# Patient Record
Sex: Male | Born: 1970 | Race: White | Hispanic: Yes | Marital: Single | Smoking: Never smoker
Health system: Southern US, Community
[De-identification: ages and names within clinical notes are randomized; demographics above are authoritative.]

## PROBLEM LIST (undated history)

## (undated) DIAGNOSIS — IMO0002 Reserved for concepts with insufficient information to code with codable children: Secondary | ICD-10-CM

## (undated) DIAGNOSIS — M25532 Pain in left wrist: Secondary | ICD-10-CM

## (undated) DIAGNOSIS — R112 Nausea with vomiting, unspecified: Secondary | ICD-10-CM

## (undated) DIAGNOSIS — Z9889 Other specified postprocedural states: Secondary | ICD-10-CM

---

## 2008-04-08 ENCOUNTER — Emergency Department: Payer: Self-pay | Admitting: Emergency Medicine

## 2010-10-27 ENCOUNTER — Ambulatory Visit: Payer: Self-pay | Admitting: Emergency Medicine

## 2010-11-03 ENCOUNTER — Ambulatory Visit: Payer: Self-pay | Admitting: Unknown Physician Specialty

## 2012-09-23 HISTORY — PX: OTHER SURGICAL HISTORY: SHX169

## 2013-01-25 HISTORY — PX: OTHER SURGICAL HISTORY: SHX169

## 2015-10-25 ENCOUNTER — Other Ambulatory Visit: Payer: Self-pay | Admitting: Specialist

## 2015-10-25 DIAGNOSIS — M48061 Spinal stenosis, lumbar region without neurogenic claudication: Secondary | ICD-10-CM

## 2015-10-26 ENCOUNTER — Other Ambulatory Visit: Payer: Self-pay

## 2015-11-03 ENCOUNTER — Ambulatory Visit
Admission: RE | Admit: 2015-11-03 | Discharge: 2015-11-03 | Disposition: A | Discharge status: Home / Self Care | Payer: Worker's Compensation | Source: Ambulatory Visit | Attending: Specialist | Admitting: Specialist

## 2015-11-03 DIAGNOSIS — M48061 Spinal stenosis, lumbar region without neurogenic claudication: Secondary | ICD-10-CM

## 2015-11-03 MED ORDER — IOHEXOL 180 MG/ML  SOLN
15.0000 mL | Freq: Once | INTRAMUSCULAR | Status: AC | PRN
  Administered 2015-11-03: 15 mL via INTRATHECAL

## 2015-11-03 MED ORDER — ONDANSETRON HCL 4 MG/2ML IJ SOLN
4.0000 mg | Freq: Once | INTRAMUSCULAR | Status: AC
  Administered 2015-11-03: 4 mg via INTRAMUSCULAR

## 2015-11-03 MED ORDER — DIAZEPAM 5 MG PO TABS
5.0000 mg | ORAL_TABLET | Freq: Once | ORAL | Status: AC
  Administered 2015-11-03: 5 mg via ORAL

## 2015-11-03 MED ORDER — MEPERIDINE HCL 100 MG/ML IJ SOLN
75.0000 mg | Freq: Once | INTRAMUSCULAR | Status: AC
  Administered 2015-11-03: 75 mg via INTRAMUSCULAR

## 2015-11-03 NOTE — Discharge Instructions (Signed)

## 2015-11-03 NOTE — Progress Notes (Signed)
Called Barbara CowerJason, the pt's boss and explained he could not work tomorrow and that I would write an excuse for him to bring with him on Sat.

## 2015-11-03 NOTE — Progress Notes (Signed)
Discharge instructions explained to pt by interpreter.

## 2016-03-15 ENCOUNTER — Ambulatory Visit: Payer: Self-pay | Admitting: Orthopedic Surgery

## 2016-03-19 ENCOUNTER — Ambulatory Visit: Payer: Self-pay | Admitting: Orthopedic Surgery

## 2016-03-19 NOTE — H&P (Signed)
Kenneth Navarro is an 45 y.o. male.   Chief Complaint: back and L leg pain HPI: The patient is a 45 year old male who presents today for follow up of their back. The patient is being followed for their low back symptoms. They are now 3 years, 3 months out from injury (DOI 09/17/12). Symptoms reported today include: pain. Current treatment includes: activity modification and pain medications. The following medication has been used for pain control: Tylenol (prn). The patient presents today following lumbar CT/Myelogram. Note for "Follow-up back": The patient is working full duty.  Kenneth Navarro follows up, having continued left lower extremity radicular pain, it is radiating actually into the top of his foot and great toe. He has had a CT myelogram and that is here for evaluation. He is now nearly three years into the symptoms with his symptomatology. He has had selective nerve root blocks at L4-5 with temporary relief of the symptoms. He would like a prescription for pain medicine as it has worsened over the past couple of months.  No past medical history on file.  No past surgical history on file.  No family history on file. Social History:  has no tobacco, alcohol, and drug history on file.  Allergies: No Known Allergies   (Not in a hospital admission)  No results found for this or any previous visit (from the past 48 hour(s)). No results found.  Review of Systems  Constitutional: Negative.   HENT: Negative.   Eyes: Negative.   Respiratory: Negative.   Cardiovascular: Negative.   Gastrointestinal: Negative.   Genitourinary: Negative.   Musculoskeletal: Positive for back pain.  Skin: Negative.   Neurological: Positive for sensory change and focal weakness.  Psychiatric/Behavioral: Negative.     There were no vitals taken for this visit. Physical Exam  Constitutional: He is oriented to person, place, and time. He appears well-developed.  HENT:  Head: Normocephalic.  Eyes:  Pupils are equal, round, and reactive to light.  Neck: Normal range of motion.  Cardiovascular: Normal rate.   Respiratory: Effort normal.  GI: Soft.  Musculoskeletal:  On exam, moderate distress. Straight leg raise marked buttock, thigh and calf on the left, negative on the right, but EHL weakness 4+/5 left compared to the right. He has pain with extension and flexion of the lumbar spine. Lumbar spine exam reveals no evidence of soft tissue swelling, deformity or skin ecchymosis. On palpation there is no tenderness of the lumbar spine. No flank pain with percussion. The abdomen is soft and nontender. Nontender over the trochanters. No cellulitis or lymphadenopathy.  Motor is 5/5 including tibialis anterior, plantar flexion, quadriceps and hamstrings. Patient is normoreflexic. There is no Babinski or clonus. Sensory exam is intact to light touch. Patient has good distal pulses. No DVT. No pain and normal range of motion without instability of the hips, knees and ankles.   Neurological: He is alert and oriented to person, place, and time.    Prior MRI 2014 reviewed with a disc bulge at L5-S1. No neurocompressive lesions. Multiple levels of disc degeneration. Repeat x-rays ordered, obtained, reviewed today. AP, lateral, flexion extension, reviewed by Dr. Shelle Iron. No fracture, subluxation, dislocation, lytic or blastic lesions. There is disc degeneration noted at L5-S1 with some loss of disc space, but without full collapse. There is some associated foraminal narrowing. No listhesis or instability noted.  CT myelogram actually demonstrates a worsening of his lateral recess stenosis at L4-5, previous disc at L5-S1 has resolved. Stenosis worse left than  right and a central disc extending into both neural foramen.  On the lateral extension, he has significant decrease in the dye flowing by L4-5. This is in extension. The AP myelogram is underwhelming, but the lateral is significant and in the CT scan, he  does have some facet ligamentum flavum hypertrophy. He did have an annular tear back then and he has a disc protrusion that has worsene  Assessment/Plan Progressive refractory L5 radiculopathy, myotomal weakness, dermatomal dysesthesias with neuro tension signs temporally from nerve block at L4-5 progressive since 2012.  Extensive discussion with surgery concerning his current pathology, relevant anatomy and treatment options. It is clearly worsened since 2012 including the symptomatology, he clearly has L5 radicular component on exam with significant neural tension signs, I do feel he is dynamically compressing the L5 and L4 nerve roots in the lateral recess. We discussed living with his symptoms versus microlumbar decompression versus repeat injections. The injections indicated only gave him 2 to 3 days of relief. I gave him a script for tramadol to be taken as directed. Continue with a light duty as previously discussed. He is getting help from his coworkers. We also discussed surgical decompression and microlumbar decompression. He is otherwise healthy. I had an extensive discussion of the risks and benefits of the lumbar decompression with the patient including bleeding, infection, damage to neurovascular structures, epidural fibrosis, CSF leak requiring repair. We also discussed increase in pain, adjacent segment disease, recurrent disc herniation, need for future surgery including repeat decompression and/or fusion. We also discussed risks of postoperative hematoma, paralysis, anesthetic complications including DVT, PE, death, cardiopulmonary dysfunction. In addition, the perioperative and postoperative courses were discussed in detail including the rehabilitative time and return to functional activity and work. I provided the patient with an illustrated handout and utilized the appropriate surgical models. Overnight in the hospital, two weeks of suture removal. He can return to work at that time in a  light duty position, if he so chooses. Six weeks of physical therapy followed by work conditioning, if necessary. I do believe this is related to his initial injury, it just has progressed since and we treated him over three years period of time and it has finally gotten to the point where it is intolerable. I will schedule him for that procedure.   Dorothy SparkBISSELL, Avon Mergenthaler M., PA-C 03/19/2016, 9:02 AM

## 2016-03-30 NOTE — Progress Notes (Signed)
appt booked through wife who stated he is [place] 04[place]09[place]16.  Said she understands english and can make appt for him    Has strong accent and somewhat hard to understand.  Stated he could be here for appt, and I will request Spanish Interpretor to meet him.

## 2016-04-04 ENCOUNTER — Other Ambulatory Visit (HOSPITAL_COMMUNITY): Payer: Self-pay | Admitting: *Deleted

## 2016-04-04 NOTE — Patient Instructions (Addendum)
Kenneth Navarro  04/04/2016   Your procedure is scheduled on: 04-12-16  [facility]  [facility] to 3rd floor to  Short Stay Center at 515  AM.  Call this number if you have problems the morning of surgery [phone]   Remember: ONLY 1 PERSON MAY GO WITH YOU TO SHORT STAY TO GET  READY MORNING OF YOUR SURGERY.  Do not eat food or drink liquids :After Midnight.     Take these medicines the morning of surgery with A SIP OF WATER:none                               You may not have any metal on your body including hair pins and              piercings  Do not wear jewelry, make-up, lotions, powders or perfumes, deodorant             Do not wear nail polish.  Do not shave  48 hours prior to surgery.              Men may shave face and neck.   Do not bring valuables to the hospital. [facility] IS NOT             RESPONSIBLE   FOR VALUABLES.  Contacts, dentures or bridgework may not be worn into surgery.  Leave suitcase in the car. After surgery it may be brought to your room.                  Please read over the following fact sheets you were given: _____________________________________________________________________             [facility] - Preparing [facility] Before surgery, you can play an important role.  Because skin is not sterile, your skin needs to be as free of germs as possible.  You can reduce the number of germs on your skin by washing with CHG (chlorahexidine gluconate) soap before surgery.  CHG is an antiseptic cleaner which kills germs and bonds with the skin to continue killing germs even after washing. Please DO NOT use if you have an allergy to CHG or antibacterial soaps.  If your skin becomes reddened/irritated stop using the CHG and inform your nurse when you arrive at Short Stay. Do not shave (including legs and underarms) for at least 48 hours prior to the first CHG shower.  You may shave your  face/neck. Please follow these instructions carefully:  1.  Shower with CHG Soap the night before surgery and the  morning of Surgery.  2.  If you choose to wash your hair, wash your hair first as usual with your  normal  shampoo.  3.  After you shampoo, rinse your hair and body thoroughly to remove the  shampoo.                           4.  Use CHG as you would any other liquid soap.  You can apply chg directly  to the skin and wash                       Gently with a scrungie or clean washcloth.  5.  Apply the CHG Soap to your  body ONLY FROM THE NECK DOWN.   Do not use on face/ open                           Wound or open sores. Avoid contact with eyes, ears mouth and genitals (private parts).                       Wash face,  Genitals (private parts) with your normal soap.             6.  Wash thoroughly, paying special attention to the area where your surgery  will be performed.  7.  Thoroughly rinse your body with warm water from the neck down.  8.  DO NOT shower/wash with your normal soap after using and rinsing off  the CHG Soap.                9.  Pat yourself dry with a clean towel.            10.  Wear clean pajamas.            11.  Place clean sheets on your bed the night of your first shower and do not  sleep with pets. Day of Surgery : Do not apply any lotions/deodorants the morning of surgery.  Please wear clean clothes to the hospital/surgery center.  FAILURE TO FOLLOW THESE INSTRUCTIONS MAY RESULT IN THE CANCELLATION OF YOUR SURGERY PATIENT SIGNATURE_________________________________  NURSE SIGNATURE__________________________________  ________________________________________________________________________

## 2016-04-09 ENCOUNTER — Ambulatory Visit (HOSPITAL_COMMUNITY)
Admission: RE | Admit: 2016-04-09 | Discharge: 2016-04-09 | Disposition: A | Discharge status: Home / Self Care | Payer: Worker's Compensation | Source: Ambulatory Visit | Attending: Orthopedic Surgery | Admitting: Orthopedic Surgery

## 2016-04-09 ENCOUNTER — Encounter (HOSPITAL_COMMUNITY): Payer: Self-pay

## 2016-04-09 ENCOUNTER — Encounter (HOSPITAL_COMMUNITY)
Admission: RE | Admit: 2016-04-09 | Discharge: 2016-04-09 | Disposition: A | Discharge status: Home / Self Care | Payer: Worker's Compensation | Source: Ambulatory Visit | Attending: Specialist | Admitting: Specialist

## 2016-04-09 DIAGNOSIS — M5126 Other intervertebral disc displacement, lumbar region: Secondary | ICD-10-CM | POA: Insufficient documentation

## 2016-04-09 HISTORY — DX: Pain in left wrist: M25.532

## 2016-04-09 HISTORY — DX: Other specified postprocedural states: Z98.890

## 2016-04-09 HISTORY — DX: Reserved for concepts with insufficient information to code with codable children: IMO0002

## 2016-04-09 HISTORY — DX: Nausea with vomiting, unspecified: R11.2

## 2016-04-09 LAB — BASIC METABOLIC PANEL
Anion gap: 4 — ABNORMAL LOW (ref 5–15)
BUN: 15 mg/dL (ref 6–20)
CO2: 28 mmol/L (ref 22–32)
CREATININE: 0.98 mg/dL (ref 0.61–1.24)
Calcium: 9.1 mg/dL (ref 8.9–10.3)
Chloride: 105 mmol/L (ref 101–111)
GFR calc Af Amer: >60 mL/min (ref >60)
GFR calc non Af Amer: >60 mL/min (ref >60)
Glucose, Bld: 98 mg/dL (ref 65–99)
Potassium: 4.3 mmol/L (ref 3.5–5.1)
SODIUM: 137 mmol/L (ref 135–145)

## 2016-04-09 LAB — CBC
HCT: 49.7 % (ref 39.0–52.0)
Hemoglobin: 17.2 g/dL — ABNORMAL HIGH (ref 13.0–17.0)
MCH: 30.4 pg (ref 26.0–34.0)
MCHC: 34.6 g/dL (ref 30.0–36.0)
MCV: 88 fL (ref 78.0–100.0)
PLATELETS: 189 10*3/uL (ref 150–400)
RBC: 5.65 MIL/uL (ref 4.22–5.81)
RDW: 12.6 % (ref 11.5–15.5)
WBC: 6.2 10*3/uL (ref 4.0–10.5)

## 2016-04-09 LAB — SURGICAL PCR SCREEN
MRSA, PCR: NEGATIVE
STAPHYLOCOCCUS AUREUS: NEGATIVE

## 2016-04-09 LAB — ABO/RH: ABO/RH(D): O POS

## 2016-04-09 NOTE — Progress Notes (Signed)
email confirmationon chart for spanish interpreter cesar bastias for day of surgery

## 2016-04-11 NOTE — Anesthesia Preprocedure Evaluation (Addendum)
Anesthesia Evaluation  Patient identified by MRN, date of birth, ID band Patient awake    Reviewed: Allergy & Precautions, NPO status , Patient's Chart, lab work & pertinent test results  Airway Mallampati: II       Dental  (+) Teeth Intact, Dental Advisory Given   Pulmonary neg pulmonary ROS,    breath sounds clear to auscultation       Cardiovascular negative cardio ROS   Rhythm:Regular     Neuro/Psych negative neurological ROS  negative psych ROS   GI/Hepatic negative GI ROS, Neg liver ROS,   Endo/Other  negative endocrine ROS  Renal/GU negative Renal ROS  negative genitourinary   Musculoskeletal negative musculoskeletal ROS (+)   Abdominal (+)  Abdomen: soft.    Peds negative pediatric ROS (+)  Hematology negative hematology ROS (+)   Anesthesia Other Findings   Reproductive/Obstetrics negative OB ROS                            Anesthesia Physical Anesthesia Plan  ASA: I  Anesthesia Plan: General   Post-op Pain Management:    Induction: Intravenous  Airway Management Planned: Oral ETT  Additional Equipment:   Intra-op Plan:   Post-operative Plan: Extubation in OR  Informed Consent: I have reviewed the patients History and Physical, chart, labs and discussed the procedure including the risks, benefits and alternatives for the proposed anesthesia with the patient or authorized representative who has indicated his/her understanding and acceptance.     Plan Discussed with:   Anesthesia Plan Comments: (Check am labs, Spanish only)        Anesthesia Quick Evaluation

## 2016-04-12 ENCOUNTER — Ambulatory Visit (HOSPITAL_COMMUNITY): Payer: Worker's Compensation

## 2016-04-12 ENCOUNTER — Encounter (HOSPITAL_COMMUNITY): Payer: Self-pay

## 2016-04-12 ENCOUNTER — Ambulatory Visit (HOSPITAL_COMMUNITY)
Admission: RE | Admit: 2016-04-12 | Discharge: 2016-04-13 | Disposition: A | Discharge status: Home / Self Care | Payer: Worker's Compensation | Source: Ambulatory Visit | Attending: Specialist | Admitting: Specialist

## 2016-04-12 ENCOUNTER — Ambulatory Visit (HOSPITAL_COMMUNITY): Payer: Worker's Compensation | Admitting: Anesthesiology

## 2016-04-12 ENCOUNTER — Encounter (HOSPITAL_COMMUNITY)
Admission: RE | Disposition: A | Discharge status: Home / Self Care | Payer: Self-pay | Source: Ambulatory Visit | Attending: Specialist

## 2016-04-12 DIAGNOSIS — M5126 Other intervertebral disc displacement, lumbar region: Secondary | ICD-10-CM

## 2016-04-12 DIAGNOSIS — Z419 Encounter for procedure for purposes other than remedying health state, unspecified: Secondary | ICD-10-CM

## 2016-04-12 DIAGNOSIS — M48061 Spinal stenosis, lumbar region without neurogenic claudication: Secondary | ICD-10-CM | POA: Diagnosis present

## 2016-04-12 DIAGNOSIS — M5116 Intervertebral disc disorders with radiculopathy, lumbar region: Secondary | ICD-10-CM | POA: Diagnosis not present

## 2016-04-12 HISTORY — PX: LUMBAR LAMINECTOMY/DECOMPRESSION MICRODISCECTOMY: SHX5026

## 2016-04-12 LAB — TYPE AND SCREEN
ABO/RH(D): O POS
ANTIBODY SCREEN: NEGATIVE

## 2016-04-12 SURGERY — LUMBAR LAMINECTOMY/DECOMPRESSION MICRODISCECTOMY
Anesthesia: General | Site: Back | Laterality: Left

## 2016-04-12 MED ORDER — ACETAMINOPHEN 325 MG PO TABS: 650.0000 mg | ORAL_TABLET | ORAL | Status: DC | PRN

## 2016-04-12 MED ORDER — METHOCARBAMOL 1000 MG/10ML IJ SOLN
500.0000 mg | Freq: Four times a day (QID) | INTRAVENOUS | Status: DC | PRN
  Administered 2016-04-12: 500 mg via INTRAVENOUS
  Filled 2016-04-12: qty 5
  Filled 2016-04-12: qty 550

## 2016-04-12 MED ORDER — BUPIVACAINE-EPINEPHRINE 0.5% -1:200000 IJ SOLN
INTRAMUSCULAR | Status: DC | PRN
  Administered 2016-04-12: 13 mL

## 2016-04-12 MED ORDER — ONDANSETRON HCL 4 MG/2ML IJ SOLN: 4.0000 mg | INTRAMUSCULAR | Status: DC | PRN

## 2016-04-12 MED ORDER — METHOCARBAMOL 500 MG PO TABS: 500.0000 mg | ORAL_TABLET | Freq: Four times a day (QID) | ORAL | 1 refills | Status: DC | PRN

## 2016-04-12 MED ORDER — LIDOCAINE HCL (CARDIAC) 20 MG/ML IV SOLN
INTRAVENOUS | Status: AC
  Filled 2016-04-12: qty 5

## 2016-04-12 MED ORDER — BUPIVACAINE-EPINEPHRINE 0.5% -1:200000 IJ SOLN
INTRAMUSCULAR | Status: AC
  Filled 2016-04-12: qty 1

## 2016-04-12 MED ORDER — PROMETHAZINE HCL 25 MG/ML IJ SOLN: 6.2500 mg | INTRAMUSCULAR | Status: DC | PRN

## 2016-04-12 MED ORDER — MAGNESIUM CITRATE PO SOLN: 1.0000 | Freq: Once | ORAL | Status: DC | PRN

## 2016-04-12 MED ORDER — METHOCARBAMOL 500 MG PO TABS: 500.0000 mg | ORAL_TABLET | Freq: Four times a day (QID) | ORAL | Status: DC | PRN

## 2016-04-12 MED ORDER — PROPOFOL 10 MG/ML IV BOLUS
INTRAVENOUS | Status: DC | PRN
  Administered 2016-04-12: 200 mg via INTRAVENOUS

## 2016-04-12 MED ORDER — FENTANYL CITRATE (PF) 100 MCG/2ML IJ SOLN
INTRAMUSCULAR | Status: AC
  Filled 2016-04-12: qty 2

## 2016-04-12 MED ORDER — BISACODYL 5 MG PO TBEC: 5.0000 mg | DELAYED_RELEASE_TABLET | Freq: Every day | ORAL | Status: DC | PRN

## 2016-04-12 MED ORDER — LACTATED RINGERS IV SOLN
INTRAVENOUS | Status: DC
  Administered 2016-04-12 (×2): via INTRAVENOUS

## 2016-04-12 MED ORDER — OXYCODONE-ACETAMINOPHEN 5-325 MG PO TABS: 1.0000 | ORAL_TABLET | ORAL | 0 refills | Status: DC | PRN

## 2016-04-12 MED ORDER — PHENYLEPHRINE 40 MCG/ML (10ML) SYRINGE FOR IV PUSH (FOR BLOOD PRESSURE SUPPORT)
PREFILLED_SYRINGE | INTRAVENOUS | Status: AC
  Filled 2016-04-12: qty 10

## 2016-04-12 MED ORDER — MENTHOL 3 MG MT LOZG: 1.0000 | LOZENGE | OROMUCOSAL | Status: DC | PRN

## 2016-04-12 MED ORDER — FENTANYL CITRATE (PF) 100 MCG/2ML IJ SOLN
INTRAMUSCULAR | Status: DC | PRN
  Administered 2016-04-12 (×2): 50 ug via INTRAVENOUS
  Administered 2016-04-12: 150 ug via INTRAVENOUS

## 2016-04-12 MED ORDER — CEFAZOLIN SODIUM-DEXTROSE 2-4 GM/100ML-% IV SOLN
2.0000 g | INTRAVENOUS | Status: AC
  Administered 2016-04-12: 2 g via INTRAVENOUS
  Filled 2016-04-12: qty 100

## 2016-04-12 MED ORDER — ROCURONIUM BROMIDE 100 MG/10ML IV SOLN
INTRAVENOUS | Status: DC | PRN
  Administered 2016-04-12: 5 mg via INTRAVENOUS
  Administered 2016-04-12: 45 mg via INTRAVENOUS

## 2016-04-12 MED ORDER — PHENYLEPHRINE HCL 10 MG/ML IJ SOLN
INTRAMUSCULAR | Status: DC | PRN
  Administered 2016-04-12: 80 ug via INTRAVENOUS
  Administered 2016-04-12: 40 ug via INTRAVENOUS

## 2016-04-12 MED ORDER — RISAQUAD PO CAPS
1.0000 | ORAL_CAPSULE | Freq: Every day | ORAL | Status: DC
  Administered 2016-04-12: 1 via ORAL
  Filled 2016-04-12: qty 1

## 2016-04-12 MED ORDER — THROMBIN 5000 UNITS EX SOLR
CUTANEOUS | Status: AC
  Filled 2016-04-12: qty 5000

## 2016-04-12 MED ORDER — ARTIFICIAL TEARS OP OINT
TOPICAL_OINTMENT | OPHTHALMIC | Status: AC
  Filled 2016-04-12: qty 3.5

## 2016-04-12 MED ORDER — SODIUM CHLORIDE 0.9 % IR SOLN
Status: AC
  Filled 2016-04-12: qty 1

## 2016-04-12 MED ORDER — KCL IN DEXTROSE-NACL 20-5-0.45 MEQ/L-%-% IV SOLN
INTRAVENOUS | Status: DC
  Administered 2016-04-12: 50 mL/h via INTRAVENOUS
  Filled 2016-04-12 (×2): qty 1000

## 2016-04-12 MED ORDER — DEXAMETHASONE SODIUM PHOSPHATE 10 MG/ML IJ SOLN
INTRAMUSCULAR | Status: AC
  Filled 2016-04-12: qty 1

## 2016-04-12 MED ORDER — OXYCODONE-ACETAMINOPHEN 5-325 MG PO TABS
1.0000 | ORAL_TABLET | ORAL | Status: DC | PRN
  Administered 2016-04-12: 2 via ORAL
  Administered 2016-04-12 (×2): 1 via ORAL
  Administered 2016-04-12 – 2016-04-13 (×3): 2 via ORAL
  Filled 2016-04-12 (×3): qty 2
  Filled 2016-04-12: qty 1
  Filled 2016-04-12: qty 2
  Filled 2016-04-12: qty 1

## 2016-04-12 MED ORDER — CEFAZOLIN SODIUM-DEXTROSE 2-4 GM/100ML-% IV SOLN
INTRAVENOUS | Status: AC
  Filled 2016-04-12: qty 100

## 2016-04-12 MED ORDER — MEPERIDINE HCL 50 MG/ML IJ SOLN: 6.2500 mg | INTRAMUSCULAR | Status: DC | PRN

## 2016-04-12 MED ORDER — EPHEDRINE SULFATE 50 MG/ML IJ SOLN
INTRAMUSCULAR | Status: AC
  Filled 2016-04-12: qty 1

## 2016-04-12 MED ORDER — HYDROCODONE-ACETAMINOPHEN 5-325 MG PO TABS: 1.0000 | ORAL_TABLET | ORAL | Status: DC | PRN

## 2016-04-12 MED ORDER — ONDANSETRON HCL 4 MG/2ML IJ SOLN
INTRAMUSCULAR | Status: AC
  Filled 2016-04-12: qty 2

## 2016-04-12 MED ORDER — ONDANSETRON HCL 4 MG/2ML IJ SOLN
INTRAMUSCULAR | Status: DC | PRN
  Administered 2016-04-12: 4 mg via INTRAVENOUS

## 2016-04-12 MED ORDER — PHENOL 1.4 % MT LIQD
1.0000 | OROMUCOSAL | Status: DC | PRN
  Filled 2016-04-12: qty 177

## 2016-04-12 MED ORDER — EPHEDRINE SULFATE 50 MG/ML IJ SOLN
INTRAMUSCULAR | Status: DC | PRN
  Administered 2016-04-12: 10 mg via INTRAVENOUS

## 2016-04-12 MED ORDER — ACETAMINOPHEN 650 MG RE SUPP: 650.0000 mg | RECTAL | Status: DC | PRN

## 2016-04-12 MED ORDER — SODIUM CHLORIDE 0.9 % IR SOLN
Status: DC | PRN
  Administered 2016-04-12: 500 mL

## 2016-04-12 MED ORDER — DOCUSATE SODIUM 100 MG PO CAPS
100.0000 mg | ORAL_CAPSULE | Freq: Two times a day (BID) | ORAL | Status: DC
  Administered 2016-04-12 – 2016-04-13 (×2): 100 mg via ORAL
  Filled 2016-04-12 (×2): qty 1

## 2016-04-12 MED ORDER — DOCUSATE SODIUM 100 MG PO CAPS: 100.0000 mg | ORAL_CAPSULE | Freq: Two times a day (BID) | ORAL | 1 refills | Status: DC | PRN

## 2016-04-12 MED ORDER — ROCURONIUM BROMIDE 100 MG/10ML IV SOLN
INTRAVENOUS | Status: AC
  Filled 2016-04-12: qty 1

## 2016-04-12 MED ORDER — SUGAMMADEX SODIUM 200 MG/2ML IV SOLN
INTRAVENOUS | Status: AC
  Filled 2016-04-12: qty 2

## 2016-04-12 MED ORDER — LIDOCAINE 2% (20 MG/ML) 5 ML SYRINGE
INTRAMUSCULAR | Status: DC | PRN
  Administered 2016-04-12: 100 mg via INTRAVENOUS

## 2016-04-12 MED ORDER — HYDROMORPHONE HCL 1 MG/ML IJ SOLN: 0.5000 mg | INTRAMUSCULAR | Status: DC | PRN

## 2016-04-12 MED ORDER — POLYETHYLENE GLYCOL 3350 17 G PO PACK: 17.0000 g | PACK | Freq: Every day | ORAL | Status: DC | PRN

## 2016-04-12 MED ORDER — SUGAMMADEX SODIUM 200 MG/2ML IV SOLN
INTRAVENOUS | Status: DC | PRN
  Administered 2016-04-12: 200 mg via INTRAVENOUS

## 2016-04-12 MED ORDER — CEFAZOLIN SODIUM-DEXTROSE 2-4 GM/100ML-% IV SOLN
2.0000 g | Freq: Three times a day (TID) | INTRAVENOUS | Status: AC
  Administered 2016-04-12 (×2): 2 g via INTRAVENOUS
  Filled 2016-04-12 (×2): qty 100

## 2016-04-12 MED ORDER — FENTANYL CITRATE (PF) 100 MCG/2ML IJ SOLN
25.0000 ug | INTRAMUSCULAR | Status: AC | PRN
  Administered 2016-04-12 (×6): 25 ug via INTRAVENOUS

## 2016-04-12 MED ORDER — DEXAMETHASONE SODIUM PHOSPHATE 10 MG/ML IJ SOLN
INTRAMUSCULAR | Status: DC | PRN
  Administered 2016-04-12: 10 mg via INTRAVENOUS

## 2016-04-12 MED ORDER — ALUM & MAG HYDROXIDE-SIMETH 200-200-20 MG/5ML PO SUSP: 30.0000 mL | Freq: Four times a day (QID) | ORAL | Status: DC | PRN

## 2016-04-12 MED ORDER — POLYETHYLENE GLYCOL 3350 17 G PO PACK: 17.0000 g | PACK | Freq: Every day | ORAL | 0 refills | Status: DC

## 2016-04-12 MED ORDER — MIDAZOLAM HCL 2 MG/2ML IJ SOLN
INTRAMUSCULAR | Status: AC
  Filled 2016-04-12: qty 2

## 2016-04-12 MED ORDER — FENTANYL CITRATE (PF) 250 MCG/5ML IJ SOLN
INTRAMUSCULAR | Status: AC
  Filled 2016-04-12: qty 5

## 2016-04-12 MED ORDER — PROPOFOL 10 MG/ML IV BOLUS
INTRAVENOUS | Status: AC
  Filled 2016-04-12: qty 20

## 2016-04-12 MED ORDER — MIDAZOLAM HCL 5 MG/5ML IJ SOLN
INTRAMUSCULAR | Status: DC | PRN
  Administered 2016-04-12: 2 mg via INTRAVENOUS

## 2016-04-12 SURGICAL SUPPLY — 44 items
BAG ZIPLOCK 12X15 (MISCELLANEOUS) IMPLANT
CLOSURE WOUND 1/2 X4 (GAUZE/BANDAGES/DRESSINGS) ×1
CLOTH 2% CHLOROHEXIDINE 3PK (PERSONAL CARE ITEMS) ×3 IMPLANT
DRAPE MICROSCOPE LEICA (MISCELLANEOUS) ×3 IMPLANT
DRAPE SHEET LG 3/4 BI-LAMINATE (DRAPES) IMPLANT
DRAPE SURG 17X11 SM STRL (DRAPES) ×3 IMPLANT
DRAPE UTILITY XL STRL (DRAPES) ×3 IMPLANT
DRSG AQUACEL AG ADV 3.5X 4 (GAUZE/BANDAGES/DRESSINGS) ×3 IMPLANT
DRSG AQUACEL AG ADV 3.5X 6 (GAUZE/BANDAGES/DRESSINGS) IMPLANT
DURAPREP 26ML APPLICATOR (WOUND CARE) ×3 IMPLANT
DURASEAL SPINE SEALANT 3ML (MISCELLANEOUS) IMPLANT
ELECT BLADE TIP CTD 4 INCH (ELECTRODE) ×3 IMPLANT
ELECT REM PT RETURN 9FT ADLT (ELECTROSURGICAL) ×3
ELECTRODE REM PT RTRN 9FT ADLT (ELECTROSURGICAL) ×1 IMPLANT
GLOVE BIOGEL PI IND STRL 7.0 (GLOVE) ×1 IMPLANT
GLOVE BIOGEL PI INDICATOR 7.0 (GLOVE) ×2
GLOVE SURG SS PI 7.0 STRL IVOR (GLOVE) ×3 IMPLANT
GLOVE SURG SS PI 7.5 STRL IVOR (GLOVE) ×3 IMPLANT
GLOVE SURG SS PI 8.0 STRL IVOR (GLOVE) ×6 IMPLANT
GOWN STRL REUS W/TWL XL LVL3 (GOWN DISPOSABLE) ×6 IMPLANT
HEMOSTAT SPONGE AVITENE ULTRA (HEMOSTASIS) ×3 IMPLANT
IV CATH 14GX2 1/4 (CATHETERS) IMPLANT
KIT BASIN OR (CUSTOM PROCEDURE TRAY) ×3 IMPLANT
KIT POSITIONING SURG ANDREWS (MISCELLANEOUS) ×3 IMPLANT
MANIFOLD NEPTUNE II (INSTRUMENTS) ×3 IMPLANT
NEEDLE SPNL 18GX3.5 QUINCKE PK (NEEDLE) ×6 IMPLANT
PACK LAMINECTOMY ORTHO (CUSTOM PROCEDURE TRAY) ×3 IMPLANT
PATTIES SURGICAL .5 X.5 (GAUZE/BANDAGES/DRESSINGS) IMPLANT
PATTIES SURGICAL .75X.75 (GAUZE/BANDAGES/DRESSINGS) ×3 IMPLANT
RUBBERBAND STERILE (MISCELLANEOUS) ×6 IMPLANT
SPONGE SURGIFOAM ABS GEL 100 (HEMOSTASIS) ×3 IMPLANT
STAPLER VISISTAT (STAPLE) IMPLANT
STRIP CLOSURE SKIN 1/2X4 (GAUZE/BANDAGES/DRESSINGS) ×2 IMPLANT
SUT NURALON 4 0 TR CR/8 (SUTURE) IMPLANT
SUT PROLENE 3 0 PS 2 (SUTURE) IMPLANT
SUT VIC AB 1 CT1 27 (SUTURE)
SUT VIC AB 1 CT1 27XBRD ANTBC (SUTURE) IMPLANT
SUT VIC AB 1-0 CT2 27 (SUTURE) ×3 IMPLANT
SUT VIC AB 2-0 CT1 27 (SUTURE)
SUT VIC AB 2-0 CT1 TAPERPNT 27 (SUTURE) IMPLANT
SUT VIC AB 2-0 CT2 27 (SUTURE) ×3 IMPLANT
SYR 3ML LL SCALE MARK (SYRINGE) IMPLANT
TOWEL OR 17X26 10 PK STRL BLUE (TOWEL DISPOSABLE) ×3 IMPLANT
YANKAUER SUCT BULB TIP NO VENT (SUCTIONS) ×3 IMPLANT

## 2016-04-12 NOTE — Transfer of Care (Signed)
Immediate Anesthesia Transfer of Care Note  Patient: Kenneth Navarro  Procedure(s) Performed: Procedure(s): MICRO LUMBAR DECOMPRESSION L4 - L5 ON THE LEFT (Left)  Patient Location: PACU  Anesthesia Type:General  Level of Consciousness:  sedated, patient cooperative and responds to stimulation  Airway & Oxygen Therapy:Patient Spontanous Breathing and Patient connected to face mask oxgen  Post-op Assessment:  Report given to PACU RN and Post -op Vital signs reviewed and stable  Post vital signs:  Reviewed and stable  Last Vitals:  Vitals:   04/12/16 0530  BP: 137/86  Pulse: 63  Resp: 16  Temp: 36.7 C    Complications: No apparent anesthesia complications

## 2016-04-12 NOTE — Anesthesia Procedure Notes (Signed)
Procedure Name: Intubation Date/Time: 04/12/2016 7:35 AM Performed by: Jhonnie GarnerMARSHALL, Chaselynn Kepple M Pre-anesthesia Checklist: Patient identified, Emergency Drugs available, Suction available and Patient being monitored Patient Re-evaluated:Patient Re-evaluated prior to inductionOxygen Delivery Method: Circle system utilized Preoxygenation: Pre-oxygenation with 100% oxygen Intubation Type: IV induction Ventilation: Mask ventilation without difficulty Laryngoscope Size: Miller and 2 Grade View: Grade I Tube type: Oral Tube size: 7.5 mm Number of attempts: 1 Airway Equipment and Method: Stylet Placement Confirmation: ETT inserted through vocal cords under direct vision,  positive ETCO2 and breath sounds checked- equal and bilateral Secured at: 21 cm Tube secured with: Tape Dental Injury: Teeth and Oropharynx as per pre-operative assessment

## 2016-04-12 NOTE — Interval H&P Note (Signed)
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 After consideration of risks, benefits and other options for treatment, the patient has consented to  Procedure(s): MICRO LUMBAR DECOMPRESSION L4 - L5 ON THE LEFT (Left) as a surgical intervention .  The patient's history has been reviewed, patient examined, no change in status, stable for surgery.  I have reviewed the patient's chart and labs.  Questions were answered to the patient's satisfaction.     Alexande Sheerin C

## 2016-04-12 NOTE — Discharge Instructions (Signed)

## 2016-04-12 NOTE — Anesthesia Postprocedure Evaluation (Signed)
Anesthesia Post Note  Patient: Kenneth Navarro  Procedure(s) Performed: Procedure(s) (LRB): MICRO LUMBAR DECOMPRESSION L4 - L5 ON THE LEFT (Left)  Patient location during evaluation: PACU Anesthesia Type: General Level of consciousness: awake and alert Pain management: pain level controlled Vital Signs Assessment: post-procedure vital signs reviewed and stable Respiratory status: spontaneous breathing, nonlabored ventilation, respiratory function stable and patient connected to nasal cannula oxygen Cardiovascular status: blood pressure returned to baseline and stable Postop Assessment: no signs of nausea or vomiting Anesthetic complications: no    Last Vitals:  Vitals:   04/12/16 1005 04/12/16 1015  BP:  124/83  Pulse: 84 86  Resp: (!) 8 12  Temp:      Last Pain:  Vitals:   04/12/16 1015  TempSrc:   PainSc: Asleep                 Sebastian Acheheodore Casper Pagliuca

## 2016-04-12 NOTE — Op Note (Signed)
NAMDorcas Carrow:  Zuk, Powell                ACCOUNT NO.:  [id number]  MEDICAL RECORD NO.:  [id number]  LOCATION:  [facility]                         [facility]  PHYSICIAN:  Jene EveryJeffrey Odessia Asleson, M.D.    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  POSTOPERATIVE DIAGNOSIS:  Spinal stenosis, herniated nucleus pulposus at L4-5, left.  PROCEDURE PERFORMED: 1. Microlumbar decompression L4-5, left. 2. Foraminotomies, L4-L5, left. 3. Microdiskectomy, 4-5, left.  ANESTHESIA:  General.  ASSISTANT:  Lanna PocheJacqueline Bissell, PA.  SPECIMEN:  Disk, 4-5 to Pathology.  HISTORY:  A 44, L5 radiculopathy secondary to HNP and spinal stenosis compressing the 5 root, refractory to conservative treatment indicated for decompression of the nerve root by microdiskectomy and decompression.  Risks and benefits were discussed including bleeding, infection, damage to neurovascular structures, DVT, PE, anesthetic complications, suboptimal range of motion, etc.  TECHNIQUE:  With the patient in supine position, after induction of adequate general anesthesia, 2 g of Kefzol, placed prone on the [place] frame.  All bony prominences were well padded.  Lumbar region was prepped and draped in usual sterile fashion.  Two 18-gauge spinal needles were utilized to localize the 4-5 interspace, confirmed with x- ray.  Incision was made from spinous process at 4-5.  Subcutaneous tissue was dissected.  Electrocautery was utilized to achieve hemostasis.  Dorsolumbar fascia was identified and divided in line with skin incision.  Paraspinous muscle elevated from lamina of 4-5. McCullough retractor was placed, operating microscope was draped and brought on the surgical field.  Confirmatory radiograph obtained at 4-5. Next, microcurette utilized to detach the ligamentum flavum  from the cephalad edge of 5.  Hemilaminotomy of the caudad edge of L4 was performed to detach the ligamentum flavum preserving the pars.  I placed a neuro patty beneath the ligamentum flavum protecting the neural elements.  I gently mobilized the L5 nerve root medially, noted it was compressing the lateral recess secondary to disk herniation, facet ligamentum flavum hypertrophy.  I performed a generous foraminotomy of the L5, decompressed the lateral recess the medial border of the pedicle, performed a foraminotomy of 4.  Focal HNP and a vascular leash was tethering the 5 root.  This was cauterized and lysed. I performed an annulotomy, copious portion of disk material was removed in the disk space with a straight upbiting and micropituitary, mobilized with an Epstein, preserving the endplates.  Copiously irrigated with catheter lavage.  Antibiotic irrigation and removed additional fragments.  Confirmatory radiograph obtained.  No further disk herniation obtained.  Specimen was sent to Pathology.  A 1-cm of excursion of the 5 root medial to pedicle without tension was noted. Neuro probe passed freely up the foramen of 4 and 5 without compression. No CSF leakage or active bleeding.  Placed thrombin-soaked Gelfoam in the laminotomy defect, removed the McCullough retractor, copiously irrigated the wound.  Paraspinous muscle was inspected, no active bleeding.  Dorsolumbar fascia was reapproximated with 1 Vicryl, subcu with 2-0, and skin with Prolene.  Sterile dressing applied.  Placed supine on the hospital  bed, extubated without difficulty, and transported to the recovery room in satisfactory condition.  The patient tolerated the procedure well.  No complications.  Minimal blood loss.  Assistant, Lanna PocheJacqueline Bissell, PA was used throughout the case for the patient positioning, gentle neural traction and closure.     Jene EveryJeffrey Reiko Vinje, M.D.     Cordelia PenJB/MEDQ  D:  04/12/2016  T:  04/12/2016   Job:  [id number]

## 2016-04-12 NOTE — Brief Op Note (Signed)
04/12/2016  8:55 AM  PATIENT:  Kenneth Navarro  45 y.o. male  PRE-OPERATIVE DIAGNOSIS:  stenosis and HNP L4 -L5 on the left  POST-OPERATIVE DIAGNOSIS:  * No post-op diagnosis entered *  PROCEDURE:  Procedure(s): MICRO LUMBAR DECOMPRESSION L4 - L5 ON THE LEFT (Left)  SURGEON:  Surgeon(s) and Role:    * Jene EveryJeffrey Saranya Harlin, MD - Primary  PHYSICIAN ASSISTANT:   ASSISTANTS: Bissell   ANESTHESIA:   general  EBL:  Total I/O In: 1500 [I.V.:1500] Out: 20 [Blood:20]  BLOOD ADMINISTERED:none  DRAINS: none   LOCAL MEDICATIONS USED:  MARCAINE     SPECIMEN:  Source of Specimen:  L45  DISPOSITION OF SPECIMEN:  PATHOLOGY  COUNTS:  YES  TOURNIQUET:  * No tourniquets in log *  DICTATION: .Other Dictation: Dictation Number [id number]  PLAN OF CARE: Admit for overnight observation  PATIENT DISPOSITION:  PACU - hemodynamically stable.   Delay start of Pharmacological VTE agent (>24hrs) due to surgical blood loss or risk of bleeding: yes

## 2016-04-12 NOTE — H&P (View-Only) (Signed)
Kenneth Navarro is an 45 y.o. male.   Chief Complaint: back and L leg pain HPI: The patient is a 45 year old male who presents today for follow up of their back. The patient is being followed for their low back symptoms. They are now 3 years, 3 months out from injury (DOI 09/17/12). Symptoms reported today include: pain. Current treatment includes: activity modification and pain medications. The following medication has been used for pain control: Tylenol (prn). The patient presents today following lumbar CT/Myelogram. Note for "Follow-up back": The patient is working full duty.  Kenneth Navarro follows up, having continued left lower extremity radicular pain, it is radiating actually into the top of his foot and great toe. He has had a CT myelogram and that is here for evaluation. He is now nearly three years into the symptoms with his symptomatology. He has had selective nerve root blocks at L4-5 with temporary relief of the symptoms. He would like a prescription for pain medicine as it has worsened over the past couple of months.  No past medical history on file.  No past surgical history on file.  No family history on file. Social History:  has no tobacco, alcohol, and drug history on file.  Allergies: No Known Allergies   (Not in a hospital admission)  No results found for this or any previous visit (from the past 48 hour(s)). No results found.  Review of Systems  Constitutional: Negative.   HENT: Negative.   Eyes: Negative.   Respiratory: Negative.   Cardiovascular: Negative.   Gastrointestinal: Negative.   Genitourinary: Negative.   Musculoskeletal: Positive for back pain.  Skin: Negative.   Neurological: Positive for sensory change and focal weakness.  Psychiatric/Behavioral: Negative.     There were no vitals taken for this visit. Physical Exam  Constitutional: He is oriented to person, place, and time. He appears well-developed.  HENT:  Head: Normocephalic.  Eyes:  Pupils are equal, round, and reactive to light.  Neck: Normal range of motion.  Cardiovascular: Normal rate.   Respiratory: Effort normal.  GI: Soft.  Musculoskeletal:  On exam, moderate distress. Straight leg raise marked buttock, thigh and calf on the left, negative on the right, but EHL weakness 4+/5 left compared to the right. He has pain with extension and flexion of the lumbar spine. Lumbar spine exam reveals no evidence of soft tissue swelling, deformity or skin ecchymosis. On palpation there is no tenderness of the lumbar spine. No flank pain with percussion. The abdomen is soft and nontender. Nontender over the trochanters. No cellulitis or lymphadenopathy.  Motor is 5/5 including tibialis anterior, plantar flexion, quadriceps and hamstrings. Patient is normoreflexic. There is no Babinski or clonus. Sensory exam is intact to light touch. Patient has good distal pulses. No DVT. No pain and normal range of motion without instability of the hips, knees and ankles.   Neurological: He is alert and oriented to person, place, and time.    Prior MRI 2014 reviewed with a disc bulge at L5-S1. No neurocompressive lesions. Multiple levels of disc degeneration. Repeat x-rays ordered, obtained, reviewed today. AP, lateral, flexion extension, reviewed by Dr. Beane. No fracture, subluxation, dislocation, lytic or blastic lesions. There is disc degeneration noted at L5-S1 with some loss of disc space, but without full collapse. There is some associated foraminal narrowing. No listhesis or instability noted.  CT myelogram actually demonstrates a worsening of his lateral recess stenosis at L4-5, previous disc at L5-S1 has resolved. Stenosis worse left than   right and a central disc extending into both neural foramen.  On the lateral extension, he has significant decrease in the dye flowing by L4-5. This is in extension. The AP myelogram is underwhelming, but the lateral is significant and in the CT scan, he  does have some facet ligamentum flavum hypertrophy. He did have an annular tear back then and he has a disc protrusion that has worsene  Assessment/Plan Progressive refractory L5 radiculopathy, myotomal weakness, dermatomal dysesthesias with neuro tension signs temporally from nerve block at L4-5 progressive since 2012.  Extensive discussion with surgery concerning his current pathology, relevant anatomy and treatment options. It is clearly worsened since 2012 including the symptomatology, he clearly has L5 radicular component on exam with significant neural tension signs, I do feel he is dynamically compressing the L5 and L4 nerve roots in the lateral recess. We discussed living with his symptoms versus microlumbar decompression versus repeat injections. The injections indicated only gave him 2 to 3 days of relief. I gave him a script for tramadol to be taken as directed. Continue with a light duty as previously discussed. He is getting help from his coworkers. We also discussed surgical decompression and microlumbar decompression. He is otherwise healthy. I had an extensive discussion of the risks and benefits of the lumbar decompression with the patient including bleeding, infection, damage to neurovascular structures, epidural fibrosis, CSF leak requiring repair. We also discussed increase in pain, adjacent segment disease, recurrent disc herniation, need for future surgery including repeat decompression and/or fusion. We also discussed risks of postoperative hematoma, paralysis, anesthetic complications including DVT, PE, death, cardiopulmonary dysfunction. In addition, the perioperative and postoperative courses were discussed in detail including the rehabilitative time and return to functional activity and work. I provided the patient with an illustrated handout and utilized the appropriate surgical models. Overnight in the hospital, two weeks of suture removal. He can return to work at that time in a  light duty position, if he so chooses. Six weeks of physical therapy followed by work conditioning, if necessary. I do believe this is related to his initial injury, it just has progressed since and we treated him over three years period of time and it has finally gotten to the point where it is intolerable. I will schedule him for that procedure.   Dorothy SparkBISSELL, JACLYN M., PA-C 03/19/2016, 9:02 AM

## 2016-04-13 ENCOUNTER — Encounter (HOSPITAL_COMMUNITY): Payer: Self-pay | Admitting: Specialist

## 2016-04-13 DIAGNOSIS — M5116 Intervertebral disc disorders with radiculopathy, lumbar region: Secondary | ICD-10-CM | POA: Diagnosis not present

## 2016-04-13 NOTE — Progress Notes (Signed)
Subjective: 1 Day Post-Op Procedure(s) (LRB): MICRO LUMBAR DECOMPRESSION L4 - L5 ON THE LEFT (Left) Patient reports pain as mild.  Reports some incisional back pain. Leg pain improved. Has not been up to ambulate yet.  Objective: Vital signs in last 24 hours: Temp:  [97.6 F (36.4 C)-98.9 F (37.2 C)] 98.5 F (36.9 C) (08/18 0523) Pulse Rate:  [60-98] 60 (08/18 0523) Resp:  [5-17] 16 (08/18 0523) BP: (111-143)/(61-86) 143/65 (08/18 0523) SpO2:  [95 %-100 %] 98 % (08/18 0523) Weight:  [80.7 kg (178 lb)] 80.7 kg (178 lb) (08/17 1255)  Intake/Output from previous day: 08/17 0701 - 08/18 0700 In: 3151.7 [P.O.:840; I.V.:2311.7] Out: 2895 [Urine:2875; Blood:20] Intake/Output this shift: No intake/output data recorded.  No results for input(s): HGB in the last 72 hours. No results for input(s): WBC, RBC, HCT, PLT in the last 72 hours. No results for input(s): NA, K, CL, CO2, BUN, CREATININE, GLUCOSE, CALCIUM in the last 72 hours. No results for input(s): LABPT, INR in the last 72 hours.  Neurologically intact ABD soft Neurovascular intact Sensation intact distally Intact pulses distally Dorsiflexion/Plantar flexion intact Incision: dressing C/D/I and no drainage No cellulitis present Compartment soft no sign of DVT  Assessment/Plan: 1 Day Post-Op Procedure(s) (LRB): MICRO LUMBAR DECOMPRESSION L4 - L5 ON THE LEFT (Left) Advance diet Up with therapy D/C IV fluids  Discussed D/C instructions Up with PT Plan D/C home today  Kenneth Corvin M. 04/13/2016, 8:02 AM

## 2016-04-13 NOTE — Evaluation (Signed)
Physical Therapy Evaluation Patient Details Name: Kenneth Navarro MRN: [id number] DOB: 08/23/1971 Today's Date: 04/13/2016   History of Present Illness  s/p L 4-5 decompression  Clinical Impression  Instructed pt in back precautions and in log roll. Gait training with RW and stair training completed. Encouraged pt to ambulate frequently at home and to limit prolonged sitting. From PT standpoint he is ready to DC home.      Follow Up Recommendations No PT follow up    Equipment Recommendations  Rolling walker with 5" wheels;3in1 (PT)    Recommendations for Other Services       Precautions / Restrictions Precautions Precautions: Back Precaution Booklet Issued: Yes (comment) Precaution Comments: instructed pt in back precautions and in log roll Restrictions Weight Bearing Restrictions: No      Mobility  Bed Mobility Overal bed mobility: Needs Assistance Bed Mobility: Rolling;Sidelying to Sit Rolling: Supervision Sidelying to sit: Supervision       General bed mobility comments: VCs for log roll technique, increased time for sidelying to sit with heavy use of BUEs  Transfers Overall transfer level: Needs assistance Equipment used: Rolling walker (2 wheeled) Transfers: Sit to/from Stand Sit to Stand: Supervision         General transfer comment: VCs for hand placement  Ambulation/Gait Ambulation/Gait assistance: Min guard Ambulation Distance (Feet): 60 Feet Assistive device: Rolling walker (2 wheeled) Gait Pattern/deviations: Step-to pattern Gait velocity: decr Gait velocity interpretation: Below normal speed for age/gender General Gait Details: steady with RW, pt reports mild dizziness with walking (BP 140/80 during seated rest, SaO2 98%, HR 58)  Stairs Stairs: Yes Stairs assistance: Min guard Stair Management: Two rails;Backwards;With walker;Step to pattern Number of Stairs: 1 General stair comments: Verbal cues for sequencing  Wheelchair Mobility     Modified Rankin (Stroke Patients Only)       Balance                                             Pertinent Vitals/Pain Pain Assessment: 0-10 Pain Score: 5  Pain Location: back, with movement Pain Descriptors / Indicators: Sore Pain Intervention(s): Limited activity within patient's tolerance;Monitored during session;Premedicated before session    Home Living Family/patient expects to be discharged to:: Private residence Living Arrangements: Spouse/significant other Available Help at Discharge: Family (wife works)   Research scientist (life sciences)Home Access: Stairs to enter   Secretary/administratorntrance Stairs-Number of Steps: 1   Home Equipment: None      Prior Function Level of Independence: Needs assistance         Comments: sometimes needed help for shoes and socks     Hand Dominance   Dominant Hand: Right    Extremity/Trunk Assessment   Upper Extremity Assessment: Defer to OT evaluation (h/o RUE injury 3 years ago)           Lower Extremity Assessment: LLE deficits/detail   LLE Deficits / Details: decr sensation to light touch L foot medially and laterally, knee ext +3/5 limited by pain, ankle DF/PF +3/5 limited by pain  Cervical / Trunk Assessment: Normal  Communication   Communication: Prefers language other than English (spanish, able to communicate well in [place])  Cognition Arousal/Alertness: Awake/alert Behavior During Therapy: WFL for tasks assessed/performed Overall Cognitive Status: Within Functional Limits for tasks assessed  General Comments      Exercises        Assessment/Plan    PT Assessment Patent does not need any further PT services  PT Diagnosis Acute pain   PT Problem List    PT Treatment Interventions     PT Goals (Current goals can be found in the Care Plan section) Acute Rehab PT Goals PT Goal Formulation: All assessment and education complete, DC therapy    Frequency     Barriers to discharge         Co-evaluation PT/OT/SLP Co-Evaluation/Treatment: Yes Reason for Co-Treatment:  (pt is spanish speaking primarily and PT speaks [place]; ) PT goals addressed during session: Mobility/safety with mobility;Proper use of DME OT goals addressed during session: ADL's and self-care       End of Session Equipment Utilized During Treatment: Gait belt Activity Tolerance: Patient limited by pain Patient left: in chair;with call bell/phone within reach Nurse Communication: Mobility status    Functional Assessment Tool Used: clinical judgement Functional Limitation: Mobility: Walking and moving around Mobility: Walking and Moving Around Current Status ([id number](G8978): At least 1 percent but less than 20 percent impaired, limited or restricted Mobility: Walking and Moving Around Goal Status [phone](G8979): At least 1 percent but less than 20 percent impaired, limited or restricted Mobility: Walking and Moving Around Discharge Status [phone](G8980): At least 1 percent but less than 20 percent impaired, limited or restricted    Time: [id number] PT Time Calculation (min) (ACUTE ONLY): 38 min   Charges:   PT Evaluation $PT Eval Low Complexity: 1 Procedure PT Treatments $Gait Training: 8-22 mins   PT G Codes:   PT G-Codes **NOT FOR INPATIENT CLASS** Functional Assessment Tool Used: clinical judgement Functional Limitation: Mobility: Walking and moving around Mobility: Walking and Moving Around Current Status ([id number](G8978): At least 1 percent but less than 20 percent impaired, limited or restricted Mobility: Walking and Moving Around Goal Status [phone](G8979): At least 1 percent but less than 20 percent impaired, limited or restricted Mobility: Walking and Moving Around Discharge Status [phone](G8980): At least 1 percent but less than 20 percent impaired, limited or restricted    Tamala SerUhlenberg, Dierdra Salameh Kistler 04/13/2016, 9:35 AM [phone]

## 2016-04-13 NOTE — Discharge Summary (Signed)
Physician Discharge Summary   Patient ID: Kenneth Navarro MRN: [id number] DOB/AGE: June 27, 1971 45 y.o.  Admit date: 04/12/2016 Discharge date: 04/13/2016  Primary Diagnosis:   stenosis and HNP L4 -L5 on the left  Admission Diagnoses:  Past Medical History:  Diagnosis Date  . HNP (herniated nucleus pulposus)    stenosis  . Pain in left wrist    middle, rign and last finger hurt all the way to elbow since fall Sep 17, 2012  . PONV (postoperative nausea and vomiting)    ponv after 1st left arm surgery   Discharge Diagnoses:   Principal Problem:   HNP (herniated nucleus pulposus), lumbar Active Problems:   Spinal stenosis of lumbar region  Procedure:  Procedure(s) (LRB): MICRO LUMBAR DECOMPRESSION L4 - L5 ON THE LEFT (Left)   Consults: None  HPI:  see H&P    Laboratory Data: Hospital Outpatient Visit on 04/09/2016  Component Date Value Ref Range Status  . MRSA, PCR 04/09/2016 NEGATIVE  NEGATIVE Final  . Staphylococcus aureus 04/09/2016 NEGATIVE  NEGATIVE Final   Comment:        The Xpert SA Assay (FDA approved for NASAL specimens in patients over 45 years of age), is one component of a comprehensive surveillance program.  Test performance has been validated by [facility] for patients greater than or equal to 87 year old. It is not intended to diagnose infection nor to guide or monitor treatment.   . Sodium 04/09/2016 137  135 - 145 mmol/L Final  . Potassium 04/09/2016 4.3  3.5 - 5.1 mmol/L Final  . Chloride 04/09/2016 105  101 - 111 mmol/L Final  . CO2 04/09/2016 28  22 - 32 mmol/L Final  . Glucose, Bld 04/09/2016 98  65 - 99 mg/dL Final  . BUN 04/09/2016 15  6 - 20 mg/dL Final  . Creatinine, Ser 04/09/2016 0.98  0.61 - 1.24 mg/dL Final  . Calcium 04/09/2016 9.1  8.9 - 10.3 mg/dL Final  . GFR calc non Af Amer 04/09/2016 >60  >60 mL/min Final  . GFR calc Af Amer 04/09/2016 >60  >60 mL/min Final   Comment: (NOTE) The eGFR has been calculated using the CKD EPI  equation. This calculation has not been validated in all clinical situations. eGFR's persistently <60 mL/min signify possible Chronic Kidney Disease.   . Anion gap 04/09/2016 4* 5 - 15 Final  . WBC 04/09/2016 6.2  4.0 - 10.5 K/uL Final  . RBC 04/09/2016 5.65  4.22 - 5.81 MIL/uL Final  . Hemoglobin 04/09/2016 17.2* 13.0 - 17.0 g/dL Final  . HCT 04/09/2016 49.7  39.0 - 52.0 % Final  . MCV 04/09/2016 88.0  78.0 - 100.0 fL Final  . MCH 04/09/2016 30.4  26.0 - 34.0 pg Final  . MCHC 04/09/2016 34.6  30.0 - 36.0 g/dL Final  . RDW 04/09/2016 12.6  11.5 - 15.5 % Final  . Platelets 04/09/2016 189  150 - 400 K/uL Final  . ABO/RH(D) 04/12/2016 O POS   Final  . Antibody Screen 04/12/2016 NEG   Final  . Sample Expiration 04/12/2016 04/15/2016   Final  . Extend sample reason 04/12/2016 NO TRANSFUSIONS OR PREGNANCY IN THE PAST 3 MONTHS   Final  . ABO/RH(D) 04/09/2016 O POS   Final   No results for input(s): HGB in the last 72 hours. No results for input(s): WBC, RBC, HCT, PLT in the last 72 hours. No results for input(s): NA, K, CL, CO2, BUN, CREATININE, GLUCOSE, CALCIUM in the last 72  hours. No results for input(s): LABPT, INR in the last 72 hours.  X-Rays:Dg Lumbar Spine 2-3 Views  Result Date: 04/09/2016 CLINICAL DATA:  Preop lumbar surgery. EXAM: LUMBAR SPINE - 2-3 VIEW COMPARISON:  None. FINDINGS: Early degenerative disc disease changes at L5-S1 with disc space narrowing and spurring. No acute bony abnormality. No fracture or malalignment. SI joints are symmetric and unremarkable. IMPRESSION: No acute bony abnormality. Electronically Signed   By: Rolm Baptise M.D.   On: 04/09/2016 11:20   Dg Spine Portable 1 View  Result Date: 04/12/2016 CLINICAL DATA:  Surgical localization EXAM: PORTABLE SPINE - 1 VIEW COMPARISON:  04/12/2016 FINDINGS: Single lateral views of the lumbar spine submitted. There is a posterior localization instrument at L4-L5 disc level. IMPRESSION: Posterior localization  instrument at L4-L5 disc level. Electronically Signed   By: Lahoma Crocker M.D.   On: 04/12/2016 08:42   Dg Spine Portable 1 View  Result Date: 04/12/2016 CLINICAL DATA:  L4-5 laminectomy EXAM: PORTABLE SPINE - 1 VIEW COMPARISON:  Study obtained earlier in the day FINDINGS: Cross-table lateral lumbar image labeled #2 submitted. Metallic probe tip is posterior to the inferior aspect of the L4-5 interspace. No fracture or spondylolisthesis. IMPRESSION: Metallic probe tip is posterior to the inferior aspect of the L4-5 interspace. No fracture or spondylolisthesis. Electronically Signed   By: Lowella Grip III M.D.   On: 04/12/2016 08:17   Dg Spine Portable 1 View  Result Date: 04/12/2016 CLINICAL DATA:  Elective surgery L4-L5 level EXAM: PORTABLE SPINE - 1 VIEW COMPARISON:  04/09/2016 FINDINGS: Single lateral view of the lumbar spine submitted. There is a posterior localization needle at the level of L4 vertebral body just above the spinous process of L4. A second posterior localization needle at the level of L4-L5 disc space just inferior to spinous process of L4. There is disc space flattening at L5-S1 level. IMPRESSION: There is a posterior localization needle at the level of L4 vertebral body just above the spinous process of L4. A second posterior localization needle at the level of L4-L5 disc space just inferior to spinous process of L4. There is disc space flattening at L5-S1 level. Electronically Signed   By: Lahoma Crocker M.D.   On: 04/12/2016 08:05    EKG:No orders found for this or any previous visit.   Hospital Course: Patient was admitted to [facility] and taken to the OR and underwent the above state procedure without complications.  Patient tolerated the procedure well and was later transferred to the recovery room and then to the orthopaedic floor for postoperative care.  They were given PO and IV analgesics for pain control following their surgery.  They were given 24 hours of  postoperative antibiotics.   PT was consulted postop to assist with mobility and transfers.  The patient was allowed to be WBAT with therapy and was taught back precautions. Discharge planning was consulted to help with postop disposition and equipment needs.  Patient had a good night on the evening of surgery and started to get up OOB with therapy on day one. Patient was seen in rounds and was ready to go home on day one.  They were given discharge instructions and dressing directions.  They were instructed on when to follow up in the office with Dr. Tonita Cong.   Diet: Regular diet Activity:WBAT; Lspine precautions Follow-up:in 10-14 days Disposition - Home Discharged Condition: good   Discharge Instructions    Call MD / Call 911    Complete by:  As directed   If you experience chest pain or shortness of breath, CALL 911 and be transported to the hospital emergency room.  If you develope a fever above 101 F, pus (white drainage) or increased drainage or redness at the wound, or calf pain, call your surgeon's office.   Constipation Prevention    Complete by:  As directed   Drink plenty of fluids.  Prune juice may be helpful.  You may use a stool softener, such as Colace (over the counter) 100 mg twice a day.  Use MiraLax (over the counter) for constipation as needed.   Diet - low sodium heart healthy    Complete by:  As directed   Increase activity slowly as tolerated    Complete by:  As directed       Medication List    TAKE these medications   acetaminophen 500 MG tablet Commonly known as:  TYLENOL Take 500-1,000 mg by mouth every 6 (six) hours as needed for mild pain.   docusate sodium 100 MG capsule Commonly known as:  COLACE Take 1 capsule (100 mg total) by mouth 2 (two) times daily as needed for mild constipation.   methocarbamol 500 MG tablet Commonly known as:  ROBAXIN Take 1 tablet (500 mg total) by mouth every 6 (six) hours as needed for muscle spasms.     oxyCODONE-acetaminophen 5-325 MG tablet Commonly known as:  PERCOCET Take 1 tablet by mouth every 4 (four) hours as needed for severe pain.   polyethylene glycol packet Commonly known as:  MIRALAX / GLYCOLAX Take 17 g by mouth daily.      Follow-up Information    BEANE,JEFFREY C, MD Follow up in 2 week(s).   Specialty:  Orthopedic Surgery Contact information: [address] [place] [address] [phone]        Johnn Hai, MD In 2 weeks.   Specialty:  Orthopedic Surgery Contact information: [address] [place] [address] [phone]           Signed: Lacie Draft, PA-C Orthopaedic Surgery 04/13/2016, 8:04 AM

## 2016-04-13 NOTE — Evaluation (Signed)
Occupational Therapy Evaluation Patient Details Name: Kenneth BarbanSergio D Wiseman MRN: [id number] DOB: 11/21/1970 Today's Date: 04/13/2016    History of Present Illness s/p L 4-5 decompression   Clinical Impression   This 45 year old man was admitted for the above sx. All education was  Completed.  No further OT Is needed at this time    Follow Up Recommendations  No OT follow up    Equipment Recommendations  3 in 1 bedside comode    Recommendations for Other Services       Precautions / Restrictions Precautions Precautions: Back Precaution Booklet Issued: Yes (comment) Precaution Comments: instructed pt in back precautions and in log roll Restrictions Weight Bearing Restrictions: No      Mobility Bed Mobility Overal bed mobility: Needs Assistance Bed Mobility: Rolling;Sidelying to Sit Rolling: Supervision Sidelying to sit: Supervision       General bed mobility comments: performed with PT  Transfers Overall transfer level: Needs assistance Equipment used: Rolling walker (2 wheeled) Transfers: Sit to/from Stand Sit to Stand: Supervision         General transfer comment: VCs for hand placement    Balance                                            ADL Overall ADL's : Needs assistance/impaired     Grooming: Set up;Supervision/safety;Sitting   Upper Body Bathing: Set up;Supervision/ safety;Sitting   Lower Body Bathing: Moderate assistance;Sit to/from stand   Upper Body Dressing : Set up;Supervision/safety;Sitting   Lower Body Dressing: Maximal assistance;Sit to/from stand   Toilet Transfer: Min guard;Comfort height toilet;Grab bars   Toileting- ArchitectClothing Manipulation and Hygiene: Minimal assistance;Sit to/from stand   Tub/ Shower Transfer: Walk-in shower;Min guard;Ambulation     General ADL Comments: educated on back precautions and gave him handout. Pt had some assistance for ADLs prior to sx, at times.  Verbalizes understanding.  Pt has  a normal commode at home and would benefit from 3:1 over toilet. Educated that this could be used for a Information systems managershower seat also     Advice workerVision     Perception     Praxis      Pertinent Vitals/Pain Pain Assessment: 0-10 Pain Score: 5  Pain Location: back, with movement Pain Descriptors / Indicators: Sore Pain Intervention(s): Limited activity within patient's tolerance;Monitored during session;Premedicated before session     Hand Dominance Right   Extremity/Trunk Assessment Upper Extremity Assessment Upper Extremity Assessment:  (pt reports problem R elbow; swelling thenar eminence)      Cervical / Trunk Assessment Cervical / Trunk Assessment: Normal   Communication Communication Communication: Prefers language other than English (spanish, able to communicate well in [place])   Cognition Arousal/Alertness: Awake/alert Behavior During Therapy: WFL for tasks assessed/performed Overall Cognitive Status: Within Functional Limits for tasks assessed                     General Comments       Exercises       Shoulder Instructions      Home Living Family/patient expects to be discharged to:: Private residence Living Arrangements: Spouse/significant other Available Help at Discharge: Family (wife works)   Home Access: Stairs to enter Secretary/administratorntrance Stairs-Number of Steps: 1         Bathroom Shower/Tub: Producer, television/film/videoWalk-in shower   Bathroom Toilet: Standard     Home Equipment: None  Prior Functioning/Environment Level of Independence: Needs assistance        Comments: sometimes needed help for shoes and socks    OT Diagnosis: Acute pain   OT Problem List:     OT Treatment/Interventions:      OT Goals(Current goals can be found in the care plan section) Acute Rehab OT Goals Patient Stated Goal: none stated OT Goal Formulation: All assessment and education complete, DC therapy  OT Frequency:     Barriers to D/C:            Co-evaluation PT/OT/SLP  Co-Evaluation/Treatment: Yes Reason for Co-Treatment:  (pt is spanish speaking primarily and PT speaks; overlapped) PT goals addressed during session: Mobility/safety with mobility;Proper use of DME OT goals addressed during session: ADL's and self-care      End of Session    Activity Tolerance: Patient tolerated treatment well Patient left: in chair;with call bell/phone within reach   Time: 0852-0922 OT Time Calculation (min): 30 min Charges:  OT General Charges $OT Visit: 1 Procedure OT Evaluation $OT Eval Low Complexity: 1 Procedure G-Codes: OT G-codes **NOT FOR INPATIENT CLASS** Functional Assessment Tool Used: clinical observation and judgment Functional Limitation: Self care Self Care Current Status (Z6109(G8987): At least 60 percent but less than 80 percent impaired, limited or restricted Self Care Goal Status (U0454(G8988): At least 60 percent but less than 80 percent impaired, limited or restricted Self Care Discharge Status [phone](G8989): At least 60 percent but less than 80 percent impaired, limited or restricted  [facility] CenterENCER,Ann-Marie Kluge 04/13/2016, 9:43 AM  Marica OtterMaryellen Chadwin Fury, OTR/L [phone] 04/13/2016

## 2016-04-13 NOTE — Care Management Note (Signed)
Case Management Note  Patient Details  Name: Kenneth Navarro MRN: [id number] Date of Birth: 02/25/1971  Subjective/Objective:      45 yo admitted with HNP lumbar              Action/Plan: Pt from home and Worker's Comp pt. This CM contacted pt adjuster Tresa Endo(Kelly [phone]) for equipment needs and left message.  This CM was contacted by by nurse case manager Alisa Graff(Jackie Currie [phone]) for equipment orders. Annice PihJackie was informed that we use [facility] for our DME and [facility]. This CM faxed orders [phone] to Annice PihJackie and alerted [facility] that he would be receiving ok from [place] to provide pt with DME. No other CM needs communicated.  Expected Discharge Date:                  Expected Discharge Plan:  Home/Self Care  In-House Referral:     Discharge planning Services  CM Consult  Post Acute Care Choice:    Choice offered to:     DME Arranged:  3-N-1, Walker rolling DME Agency:  [facility]  HH Arranged:    HH Agency:     Status of Service:  Completed, signed off  If discussed at Long Length of Stay Meetings, dates discussed:    Additional CommentsBartholome Bill:  Bobie Kistler H, RN 04/13/2016, 11:24 AM  [phone]

## 2018-04-25 IMAGING — DX DG SPINE 1V PORT
1 series · 1 of 1 positions shown · non-contrast
Comparison: Study obtained earlier in the day

CLINICAL DATA: L4-5 laminectomy

EXAM:
PORTABLE SPINE - 1 VIEW

[l-spine lat]
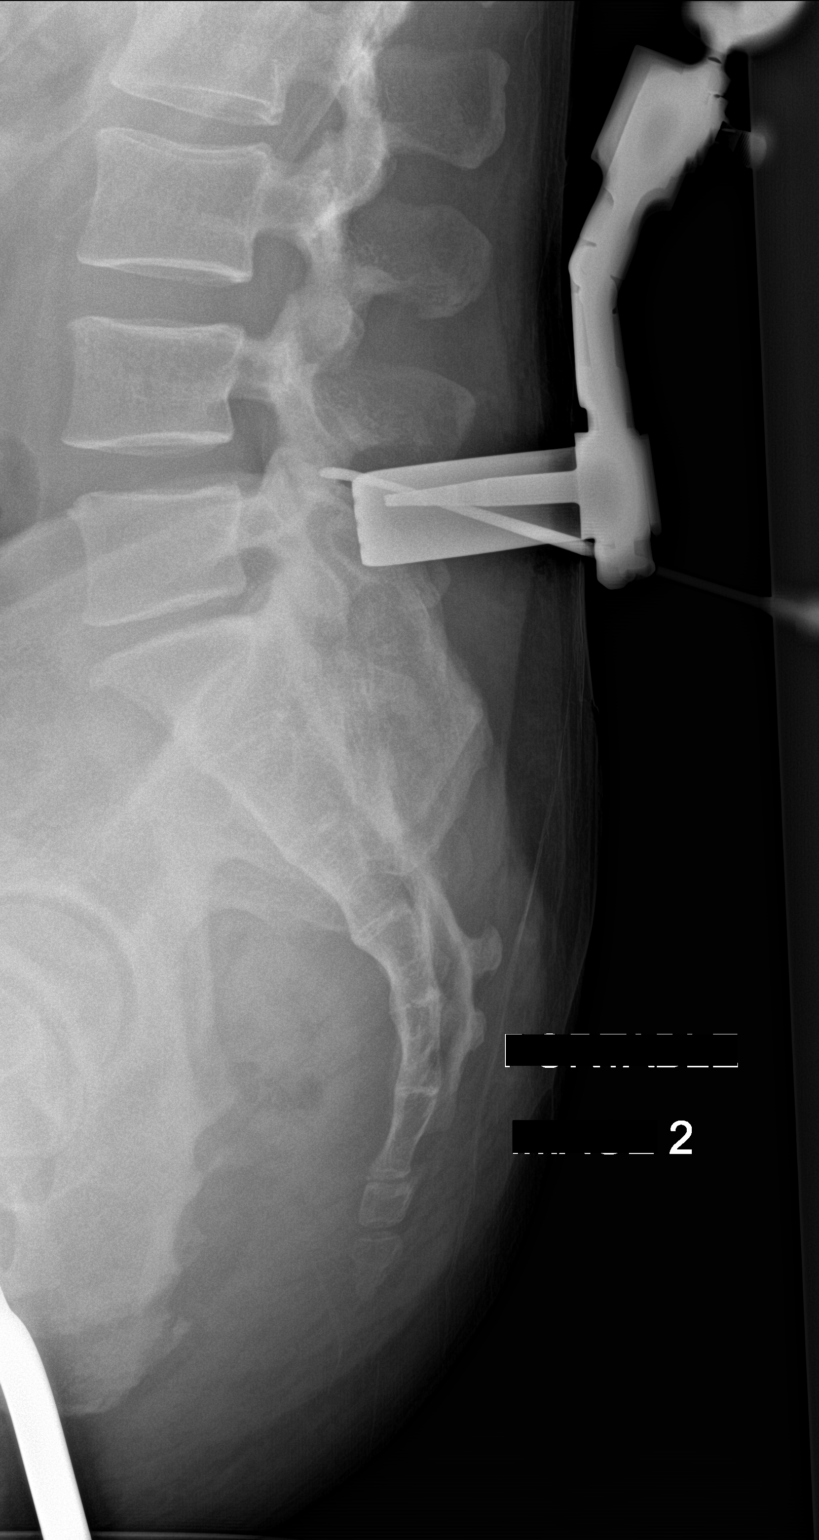

[1 of 1 positions shown; findings below may reference images not displayed]

FINDINGS: Cross-table lateral lumbar image labeled #2 submitted. Metallic
probe tip is posterior to the inferior aspect of the L4-5
interspace. No fracture or spondylolisthesis.
IMPRESSION: Metallic probe tip is posterior to the inferior aspect of the L4-5
interspace. No fracture or spondylolisthesis.

## 2018-04-25 IMAGING — DX DG SPINE 1V PORT
1 series · 1 of 1 positions shown · non-contrast
Comparison: 04/09/2016

CLINICAL DATA: Elective surgery L4-L5 level

EXAM:
PORTABLE SPINE - 1 VIEW

[l-spine lat]
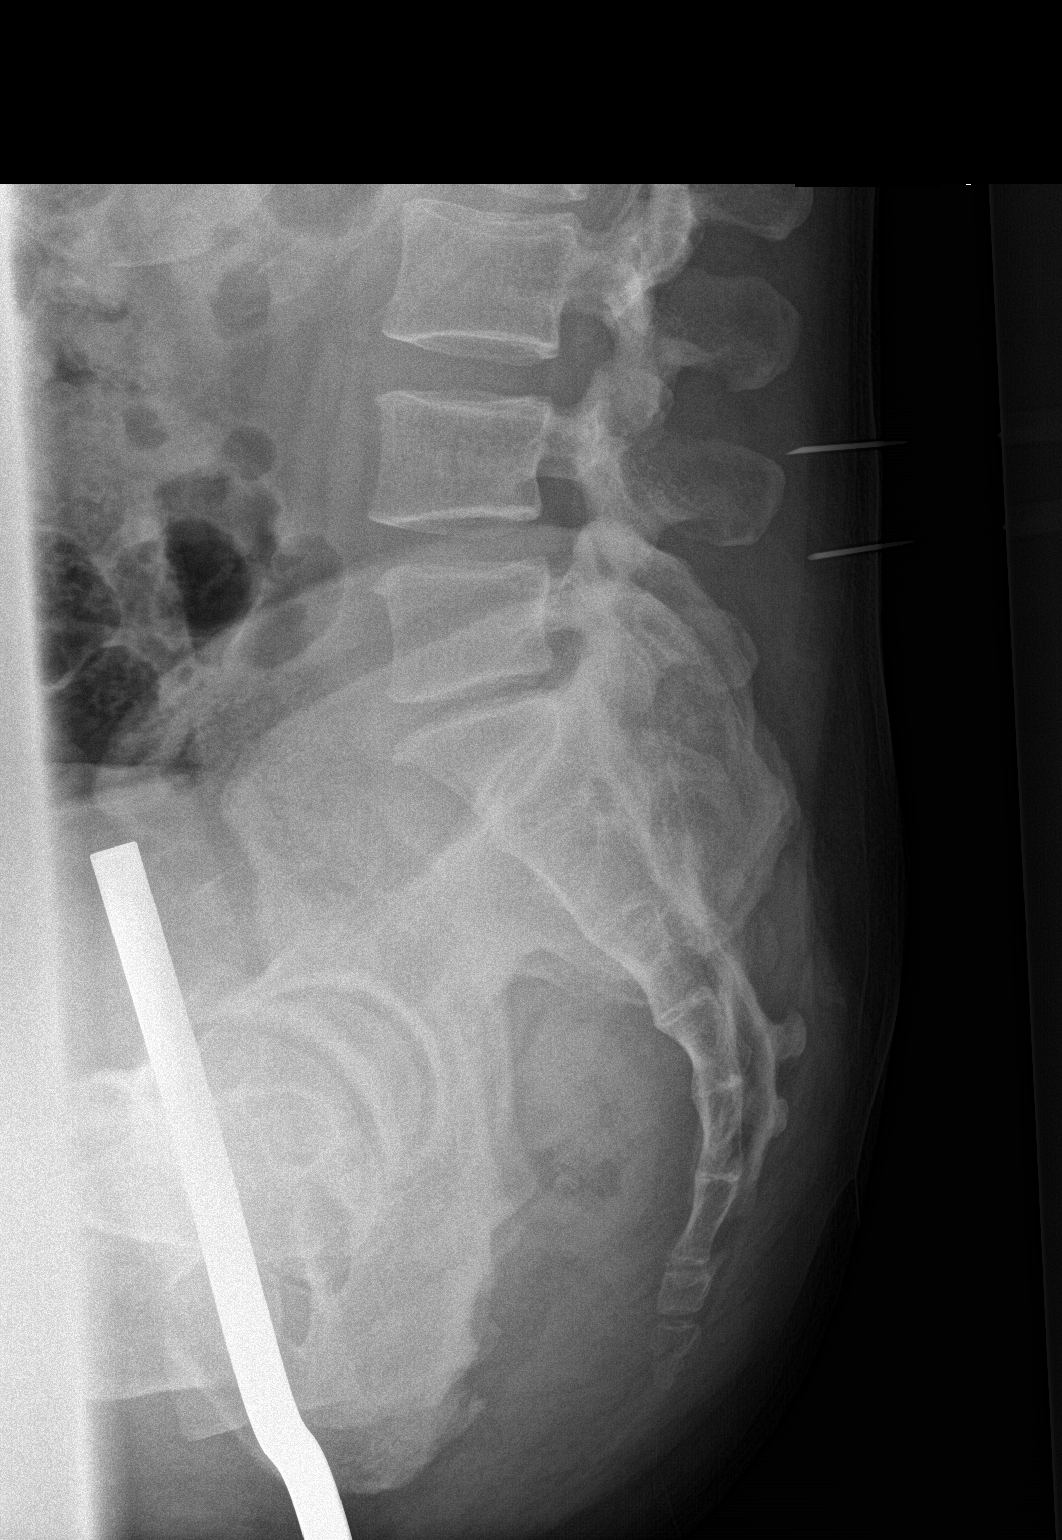

[1 of 1 positions shown; findings below may reference images not displayed]

FINDINGS: Single lateral view of the lumbar spine submitted. There is a
posterior localization needle at the level of L4 vertebral body just
above the spinous process of L4. A second posterior localization
needle at the level of L4-L5 disc space just inferior to spinous
process of L4. There is disc space flattening at L5-S1 level.
IMPRESSION: There is a posterior localization needle at the level of L4
vertebral body just above the spinous process of L4. A second
posterior localization needle at the level of L4-L5 disc space just
inferior to spinous process of L4. There is disc space flattening at
L5-S1 level.

## 2023-06-10 ENCOUNTER — Encounter: Payer: Self-pay | Admitting: Nurse Practitioner

## 2023-06-10 ENCOUNTER — Ambulatory Visit: Payer: BLUE CROSS/BLUE SHIELD | Admitting: Internal Medicine

## 2023-06-10 DIAGNOSIS — E782 Mixed hyperlipidemia: Secondary | ICD-10-CM | POA: Diagnosis not present

## 2023-06-10 DIAGNOSIS — Z125 Encounter for screening for malignant neoplasm of prostate: Secondary | ICD-10-CM | POA: Diagnosis not present

## 2023-06-10 DIAGNOSIS — Z23 Encounter for immunization: Secondary | ICD-10-CM

## 2023-06-10 DIAGNOSIS — H538 Other visual disturbances: Secondary | ICD-10-CM

## 2023-06-10 DIAGNOSIS — I10 Essential (primary) hypertension: Secondary | ICD-10-CM

## 2023-06-10 MED ORDER — TETANUS-DIPHTH-ACELL PERTUSSIS 5-2.5-18.5 LF-MCG/0.5 IM SUSP: 0.5000 mL | Freq: Once | INTRAMUSCULAR | 0 refills | Status: DC

## 2023-06-10 MED ORDER — LOSARTAN POTASSIUM 25 MG PO TABS: ORAL_TABLET | ORAL | 1 refills | Status: DC

## 2023-06-10 MED ORDER — ZOSTER VAC RECOMB ADJUVANTED 50 MCG/0.5ML IM SUSR: 0.5000 mL | Freq: Once | INTRAMUSCULAR | 0 refills | Status: DC

## 2023-06-10 NOTE — Progress Notes (Unsigned)
[facility] [address] [place], [place] [address]  Internal MEDICINE  Office Visit Note  Patient Name: Kenneth Navarro  [id number]  [id number]  Date of Service: 07/03/2023   Complaints/HPI Pt is here for establishment of PCP. Chief Complaint  Patient presents with   New Patient (Initial Visit)    High blood pressure---discuss colonoscopy    HPI C/O elevated blood pressure, has been checking it periodically Gets blurred vision as well  Had herniated disc in 2017 had L4-L5 decompression   H/O right ureteral stone s/p lithotripsy  Labs dow show elevated creatinine in the past  Denies any chest pain or sob Symptoms of OSA present as well  Current Medication: Outpatient Encounter Medications as of 06/10/2023  Medication Sig   losartan (COZAAR) 25 MG tablet Take one tab with dinner qd   [DISCONTINUED] Tdap (BOOSTRIX) 5-2.5-18.5 LF-MCG/0.5 injection Inject 0.5 mLs into the muscle once.   [DISCONTINUED] Zoster Vaccine Adjuvanted [facility]) injection Inject 0.5 mLs into the muscle once.   [DISCONTINUED] acetaminophen (TYLENOL) 500 MG tablet Take 500-1,000 mg by mouth every 6 (six) hours as needed for mild pain.  (Patient not taking: Reported on 06/10/2023)   [DISCONTINUED] docusate sodium (COLACE) 100 MG capsule Take 1 capsule (100 mg total) by mouth 2 (two) times daily as needed for mild constipation. (Patient not taking: Reported on 06/10/2023)   [DISCONTINUED] methocarbamol (ROBAXIN) 500 MG tablet Take 1 tablet (500 mg total) by mouth every 6 (six) hours as needed for muscle spasms. (Patient not taking: Reported on 06/10/2023)   [DISCONTINUED] oxyCODONE-acetaminophen (PERCOCET) 5-325 MG tablet Take 1 tablet by mouth every 4 (four) hours as needed for severe pain. (Patient not taking: Reported on 06/10/2023)   [DISCONTINUED] polyethylene glycol (MIRALAX / GLYCOLAX) packet Take 17 g by mouth daily. (Patient not taking: Reported on 06/10/2023)   [DISCONTINUED] Tdap (BOOSTRIX)  5-2.5-18.5 LF-MCG/0.5 injection Inject 0.5 mLs into the muscle once for 1 dose.   [DISCONTINUED] Zoster Vaccine Adjuvanted [facility]) injection Inject 0.5 mLs into the muscle once for 1 dose.   No facility-administered encounter medications on file as of 06/10/2023.    Surgical History: Past Surgical History:  Procedure Laterality Date   left arm pins removed  01/2013   left arm surgery  09/23/2012   after fall, pins placed   LUMBAR LAMINECTOMY/DECOMPRESSION MICRODISCECTOMY Left 04/12/2016   Procedure: MICRO LUMBAR DECOMPRESSION L4 - L5 ON THE LEFT;  Surgeon: Jene Every, MD;  Location: [facility];  Service: Orthopedics;  Laterality: Left;    Medical History: Past Medical History:  Diagnosis Date   HNP (herniated nucleus pulposus)    stenosis   Pain in left wrist    middle, rign and last finger hurt all the way to elbow since fall Sep 17, 2012   PONV (postoperative nausea and vomiting)    ponv after 1st left arm surgery    Family History: History reviewed. No pertinent family history.  Social History   Socioeconomic History   Marital status: Single    Spouse name: Not on file   Number of children: Not on file   Years of education: Not on file   Highest education level: Not on file  Occupational History   Not on file  Tobacco Use   Smoking status: Never   Smokeless tobacco: Never  Substance and Sexual Activity   Alcohol use: No   Drug use: No   Sexual activity: Not Currently  Other Topics Concern   Not on file  Social History  Narrative   Not on file   Social Determinants of Health   Financial Resource Strain: Not on file  Food Insecurity: Not on file  Transportation Needs: Not on file  Physical Activity: Not on file  Stress: Not on file  Social Connections: Not on file  Intimate Partner Violence: Not on file     Review of Systems  Constitutional:  Negative for chills, fatigue and unexpected weight change.  HENT:  Negative for congestion, postnasal drip,  rhinorrhea, sneezing and sore throat.   Eyes:  Positive for visual disturbance. Negative for redness.  Respiratory:  Negative for cough, chest tightness and shortness of breath.   Cardiovascular:  Negative for chest pain and palpitations.       Elevated blood pressure   Gastrointestinal:  Negative for abdominal pain, constipation, diarrhea, nausea and vomiting.  Genitourinary:  Negative for dysuria and frequency.  Musculoskeletal:  Negative for arthralgias, back pain, joint swelling and neck pain.  Skin:  Negative for rash.  Neurological: Negative.  Negative for tremors and numbness.  Hematological:  Negative for adenopathy. Does not bruise/bleed easily.  Psychiatric/Behavioral:  Negative for behavioral problems (Depression), sleep disturbance and suicidal ideas. The patient is not nervous/anxious.     Vital Signs: BP (!) 140/98   Pulse 66   Temp 98.6 F (37 C)   Resp 16   Ht 5\' 5"  (1.651 m)   Wt 189 lb (85.7 kg)   SpO2 96%   BMI 31.45 kg/m    Physical Exam Constitutional:      Appearance: Normal appearance.  HENT:     Head: Normocephalic and atraumatic.     Nose: Nose normal.     Mouth/Throat:     Mouth: Mucous membranes are moist.     Pharynx: No posterior oropharyngeal erythema.  Eyes:     Extraocular Movements: Extraocular movements intact.     Pupils: Pupils are equal, round, and reactive to light.  Cardiovascular:     Pulses: Normal pulses.     Heart sounds: Normal heart sounds.  Pulmonary:     Effort: Pulmonary effort is normal.     Breath sounds: Normal breath sounds.  Neurological:     General: No focal deficit present.     Mental Status: He is alert.  Psychiatric:        Mood and Affect: Mood normal.        Behavior: Behavior normal.       Assessment/Plan: 1. Uncontrolled hypertension Will start Losartan and titrate as indicated, has h/o elevated cr, will monitor kidney functions   2. Special screening, prostate cancer - PSA Total (Reflex To  Free)  3. Blurred vision Will need t have proper exam done, uncontrolled htn  4. Mixed hyperlipidemia Previous h/o, will update   5. Needs flu shot - Influenza, MDCK, trivalent, PF(Flucelvax egg-free)   General Counseling: Nero verbalizes understanding of the findings of todays visit and agrees with plan of treatment. I have discussed any further diagnostic evaluation that may be needed or ordered today. We also reviewed his medications today. he has been encouraged to call the office with any questions or concerns that should arise related to todays visit.    Counseling:  [place] Controlled Substance Database was reviewed by me.  Orders Placed This Encounter  Procedures   Influenza, MDCK, trivalent, PF(Flucelvax egg-free)   CBC with Differential/Platelet   Lipid Panel With LDL/HDL Ratio   TSH   T4, free   Comprehensive metabolic panel   PSA Total (  Reflex To Free)    Meds ordered this encounter  Medications   DISCONTD: Tdap (BOOSTRIX) 5-2.5-18.5 LF-MCG/0.5 injection    Sig: Inject 0.5 mLs into the muscle once for 1 dose.    Dispense:  0.5 mL    Refill:  0   DISCONTD: Zoster Vaccine Adjuvanted [facility]) injection    Sig: Inject 0.5 mLs into the muscle once for 1 dose.    Dispense:  0.5 mL    Refill:  0   losartan (COZAAR) 25 MG tablet    Sig: Take one tab with dinner qd    Dispense:  90 tablet    Refill:  1    Time spent:35 Minutes

## 2023-07-01 ENCOUNTER — Ambulatory Visit: Payer: BLUE CROSS/BLUE SHIELD | Admitting: Nurse Practitioner

## 2023-07-04 ENCOUNTER — Telehealth: Payer: Self-pay | Admitting: Internal Medicine

## 2023-07-04 NOTE — Telephone Encounter (Signed)
Lvm for patient's wife to return call to schedule cpe with dfk and to have him get his labs done-Toni

## 2023-07-15 ENCOUNTER — Ambulatory Visit (INDEPENDENT_AMBULATORY_CARE_PROVIDER_SITE_OTHER): Payer: BLUE CROSS/BLUE SHIELD | Admitting: Internal Medicine

## 2023-07-15 ENCOUNTER — Encounter: Payer: Self-pay | Admitting: Internal Medicine

## 2023-07-15 ENCOUNTER — Telehealth: Payer: Self-pay | Admitting: Internal Medicine

## 2023-07-15 VITALS — BP 132/93 | HR 91 | Temp 98.2°F | Resp 16 | Ht 65.0 in | Wt 189.4 lb

## 2023-07-15 DIAGNOSIS — H538 Other visual disturbances: Secondary | ICD-10-CM

## 2023-07-15 DIAGNOSIS — E782 Mixed hyperlipidemia: Secondary | ICD-10-CM

## 2023-07-15 DIAGNOSIS — I10 Essential (primary) hypertension: Secondary | ICD-10-CM

## 2023-07-15 DIAGNOSIS — Z125 Encounter for screening for malignant neoplasm of prostate: Secondary | ICD-10-CM | POA: Diagnosis not present

## 2023-07-15 DIAGNOSIS — Z0001 Encounter for general adult medical examination with abnormal findings: Secondary | ICD-10-CM | POA: Diagnosis not present

## 2023-07-15 DIAGNOSIS — Z1211 Encounter for screening for malignant neoplasm of colon: Secondary | ICD-10-CM

## 2023-07-15 MED ORDER — LOSARTAN POTASSIUM 50 MG PO TABS: 50.0000 mg | ORAL_TABLET | Freq: Every day | ORAL | 3 refills | Status: DC

## 2023-07-15 NOTE — Progress Notes (Signed)
[facility] [address] [place], [place] [address]  Internal MEDICINE  Office Visit Note  Patient Name: Kenneth Navarro  [id number]  [id number]  Date of Service: 07/15/2023  Chief Complaint  Patient presents with   Annual Exam     HPI Pt is here for routine health maintenance examination Patient speaks Spanish and called his daughter over the phone she translated the entire time during the physical Blood pressure is slightly improved his headaches are also better Patient did not understand about his lab work so he will go today Continues to have blurred vision Patient will also need a colonoscopy or Cologuard Current Medication: Outpatient Encounter Medications as of 07/15/2023  Medication Sig   losartan (COZAAR) 50 MG tablet Take 1 tablet (50 mg total) by mouth daily.   [DISCONTINUED] losartan (COZAAR) 25 MG tablet Take one tab with dinner qd   No facility-administered encounter medications on file as of 07/15/2023.    Surgical History: Past Surgical History:  Procedure Laterality Date   left arm pins removed  01/2013   left arm surgery  09/23/2012   after fall, pins placed   LUMBAR LAMINECTOMY/DECOMPRESSION MICRODISCECTOMY Left 04/12/2016   Procedure: MICRO LUMBAR DECOMPRESSION L4 - L5 ON THE LEFT;  Surgeon: Jene Every, MD;  Location: [facility];  Service: Orthopedics;  Laterality: Left;    Medical History: Past Medical History:  Diagnosis Date   HNP (herniated nucleus pulposus)    stenosis   Pain in left wrist    middle, rign and last finger hurt all the way to elbow since fall Sep 17, 2012   PONV (postoperative nausea and vomiting)    ponv after 1st left arm surgery    Family History: History reviewed. No pertinent family history.  Social History: Social History   Socioeconomic History   Marital status: Single    Spouse name: Not on file   Number of children: Not on file   Years of education: Not on file   Highest education level: Not  on file  Occupational History   Not on file  Tobacco Use   Smoking status: Never   Smokeless tobacco: Never  Substance and Sexual Activity   Alcohol use: No   Drug use: No   Sexual activity: Not Currently  Other Topics Concern   Not on file  Social History Narrative   Not on file   Social Determinants of Health   Financial Resource Strain: Not on file  Food Insecurity: Not on file  Transportation Needs: Not on file  Physical Activity: Not on file  Stress: Not on file  Social Connections: Not on file      Review of Systems  Constitutional:  Negative for fatigue and fever.  HENT:  Negative for congestion, mouth sores and postnasal drip.   Eyes:  Positive for visual disturbance.  Respiratory:  Negative for cough and shortness of breath.   Cardiovascular:  Negative for chest pain.  Gastrointestinal: Negative.  Negative for constipation.  Genitourinary:  Negative for flank pain.  Psychiatric/Behavioral: Negative.       Vital Signs: BP (!) 132/93   Pulse 91   Temp 98.2 F (36.8 C)   Resp 16   Ht 5\' 5"  (1.651 m)   Wt 189 lb 6.4 oz (85.9 kg)   SpO2 96%   BMI 31.52 kg/m    Physical Exam Constitutional:      General: He is not in acute distress.    Appearance: He is well-developed. He is  not diaphoretic.  HENT:     Head: Normocephalic and atraumatic.     Right Ear: External ear normal.     Left Ear: External ear normal.     Nose: Nose normal.     Mouth/Throat:     Pharynx: No oropharyngeal exudate.  Eyes:     General: No scleral icterus.       Right eye: No discharge.        Left eye: No discharge.     Conjunctiva/sclera: Conjunctivae normal.     Pupils: Pupils are equal, round, and reactive to light.  Neck:     Thyroid: No thyromegaly.     Vascular: No JVD.     Trachea: No tracheal deviation.  Cardiovascular:     Rate and Rhythm: Normal rate and regular rhythm.     Heart sounds: Normal heart sounds. No murmur heard.    No friction rub. No gallop.   Pulmonary:     Effort: Pulmonary effort is normal. No respiratory distress.     Breath sounds: Normal breath sounds. No stridor. No wheezing or rales.  Chest:     Chest wall: No tenderness.  Abdominal:     General: Bowel sounds are normal. There is no distension.     Palpations: Abdomen is soft. There is no mass.     Tenderness: There is no abdominal tenderness. There is no guarding or rebound.  Musculoskeletal:        General: No tenderness or deformity. Normal range of motion.     Cervical back: Normal range of motion and neck supple.  Lymphadenopathy:     Cervical: No cervical adenopathy.  Skin:    General: Skin is warm and dry.     Coloration: Skin is not pale.     Findings: No erythema or rash.  Neurological:     Mental Status: He is alert.     Cranial Nerves: No cranial nerve deficit.     Motor: No abnormal muscle tone.     Coordination: Coordination normal.     Deep Tendon Reflexes: Reflexes are normal and symmetric.  Psychiatric:        Behavior: Behavior normal.        Thought Content: Thought content normal.        Judgment: Judgment normal.         Assessment/Plan: 1. Encounter for general adult medical examination with abnormal findings All preventive health maintenance is updated verbalized with his daughter as well who was on the phone  2. Benign hypertension Will increase losartan to 50 mg once a day for optimal control of blood pressure, discontinue losartan 25 - Lipid Panel With LDL/HDL Ratio - TSH - T4, free - Comprehensive metabolic panel - Ambulatory referral to Ophthalmology - losartan (COZAAR) 50 MG tablet; Take 1 tablet (50 mg total) by mouth daily.  Dispense: 90 tablet; Refill: 3  3. Special screening, prostate cancer Ordered for screening - PSA  4. Blurred vision Patient has had uncontrolled hypertension for a while, patient is to see ophthalmology - CBC with Differential/Platelet - Ambulatory referral to Ophthalmology  5. Mixed  hyperlipidemia Patient does give history of hyperlipidemia in the past we will recheck levels - Lipid Panel With LDL/HDL Ratio - Comprehensive metabolic panel  6. Screening for colon cancer Cologuard is ordered for the patient  General Counseling: Kenneth Navarro verbalizes understanding of the findings of todays visit and agrees with plan of treatment. I have discussed any further diagnostic evaluation that may be needed  or ordered today. We also reviewed his medications today. he has been encouraged to call the office with any questions or concerns that should arise related to todays visit.    Counseling:  [place] Controlled Substance Database was reviewed by me.  Orders Placed This Encounter  Procedures   CBC with Differential/Platelet   Lipid Panel With LDL/HDL Ratio   TSH   T4, free   Comprehensive metabolic panel   PSA   Cologuard   Ambulatory referral to Ophthalmology    Meds ordered this encounter  Medications   losartan (COZAAR) 50 MG tablet    Sig: Take 1 tablet (50 mg total) by mouth daily.    Dispense:  90 tablet    Refill:  3    Total time spent:45 Minutes  Time spent includes review of chart, medications, test results, and follow up plan with the patient.     Lyndon Code, MD  Internal Medicine

## 2023-07-15 NOTE — Telephone Encounter (Signed)
Awaiting 07/15/23 office notes for Ophthalmology referral-Toni

## 2023-07-16 ENCOUNTER — Telehealth: Payer: Self-pay | Admitting: Nurse Practitioner

## 2023-07-16 NOTE — Telephone Encounter (Signed)
Ophthalmology referral faxed to [facility] due to insurance; [phone] Notified patient. Gave pt telephone # [phone]

## 2023-07-30 LAB — COMPREHENSIVE METABOLIC PANEL
ALT: 37 [IU]/L (ref 0–44)
AST: 25 [IU]/L (ref 0–40)
Albumin: 4.3 g/dL (ref 3.8–4.9)
Alkaline Phosphatase: 99 [IU]/L (ref 44–121)
BUN/Creatinine Ratio: 18 (ref 9–20)
BUN: 15 mg/dL (ref 6–24)
Bilirubin Total: 0.3 mg/dL (ref 0.0–1.2)
CO2: 23 mmol/L (ref 20–29)
Calcium: 8.8 mg/dL (ref 8.7–10.2)
Chloride: 103 mmol/L (ref 96–106)
Creatinine, Ser: 0.83 mg/dL (ref 0.76–1.27)
Globulin, Total: 2.8 g/dL (ref 1.5–4.5)
Glucose: 87 mg/dL (ref 70–99)
Potassium: 4 mmol/L (ref 3.5–5.2)
Sodium: 140 mmol/L (ref 134–144)
Total Protein: 7.1 g/dL (ref 6.0–8.5)
eGFR: 105 mL/min/{1.73_m2} (ref >59)

## 2023-07-30 LAB — LIPID PANEL WITH LDL/HDL RATIO
Cholesterol, Total: 210 mg/dL — ABNORMAL HIGH (ref 100–199)
HDL: 39 mg/dL — ABNORMAL LOW (ref >39)
LDL Chol Calc (NIH): 129 mg/dL — ABNORMAL HIGH (ref 0–99)
LDL/HDL Ratio: 3.3 {ratio} (ref 0.0–3.6)
Triglycerides: 238 mg/dL — ABNORMAL HIGH (ref 0–149)
VLDL Cholesterol Cal: 42 mg/dL — ABNORMAL HIGH (ref 5–40)

## 2023-07-30 LAB — CBC WITH DIFFERENTIAL/PLATELET
Basophils Absolute: 0.1 10*3/uL (ref 0.0–0.2)
Basos: 1 %
EOS (ABSOLUTE): 0.1 10*3/uL (ref 0.0–0.4)
Eos: 2 %
Hematocrit: 50.2 % (ref 37.5–51.0)
Hemoglobin: 16.4 g/dL (ref 13.0–17.7)
Immature Grans (Abs): 0 10*3/uL (ref 0.0–0.1)
Immature Granulocytes: 0 %
Lymphocytes Absolute: 1.6 10*3/uL (ref 0.7–3.1)
Lymphs: 29 %
MCH: 29.7 pg (ref 26.6–33.0)
MCHC: 32.7 g/dL (ref 31.5–35.7)
MCV: 91 fL (ref 79–97)
Monocytes Absolute: 0.3 10*3/uL (ref 0.1–0.9)
Monocytes: 6 %
Neutrophils Absolute: 3.4 10*3/uL (ref 1.4–7.0)
Neutrophils: 62 %
Platelets: 192 10*3/uL (ref 150–450)
RBC: 5.53 x10E6/uL (ref 4.14–5.80)
RDW: 12.9 % (ref 11.6–15.4)
WBC: 5.5 10*3/uL (ref 3.4–10.8)

## 2023-07-30 LAB — T4, FREE: Free T4: 0.78 ng/dL — ABNORMAL LOW (ref 0.82–1.77)

## 2023-07-30 LAB — PSA: Prostate Specific Ag, Serum: 0.9 ng/mL (ref 0.0–4.0)

## 2023-07-30 LAB — TSH: TSH: 2.54 u[IU]/mL (ref 0.450–4.500)

## 2023-07-30 NOTE — Progress Notes (Signed)
Abnormal lipid profile, will like him to start on medicine  Underactive thyroid, will like him to start medicine, please let me know if pt is willing, might have to speak to his daughter due to language barrier, mail low fat low chol diet as well, thank you

## 2023-07-31 ENCOUNTER — Telehealth: Payer: Self-pay

## 2023-07-31 ENCOUNTER — Other Ambulatory Visit: Payer: Self-pay | Admitting: Internal Medicine

## 2023-07-31 MED ORDER — ROSUVASTATIN CALCIUM 5 MG PO TABS: 5.0000 mg | ORAL_TABLET | Freq: Every day | ORAL | 3 refills | Status: DC

## 2023-07-31 MED ORDER — LEVOTHYROXINE SODIUM 50 MCG PO TABS: 50.0000 ug | ORAL_TABLET | Freq: Every day | ORAL | 3 refills | Status: DC

## 2023-07-31 NOTE — Telephone Encounter (Signed)
-----   Message from Beverely Risen sent at 07/31/2023  1:34 PM EST ----- 2 rx sent

## 2023-07-31 NOTE — Telephone Encounter (Signed)
Patient daughter was notified of what 2 rxs were sent.

## 2023-08-09 LAB — COLOGUARD: COLOGUARD: NEGATIVE

## 2023-08-15 NOTE — Progress Notes (Signed)
Cologuard negative 

## 2023-08-16 ENCOUNTER — Telehealth: Payer: Self-pay

## 2023-08-16 NOTE — Telephone Encounter (Signed)
-----   Message from [facility] sent at 08/15/2023  9:55 PM EST ----- Cologuard negative

## 2023-08-16 NOTE — Telephone Encounter (Signed)
Patient daughter notified

## 2023-11-11 ENCOUNTER — Ambulatory Visit: Payer: BLUE CROSS/BLUE SHIELD | Admitting: Physician Assistant

## 2023-11-18 ENCOUNTER — Ambulatory Visit (INDEPENDENT_AMBULATORY_CARE_PROVIDER_SITE_OTHER): Admitting: Physician Assistant

## 2023-11-18 ENCOUNTER — Encounter: Payer: Self-pay | Admitting: Physician Assistant

## 2023-11-18 VITALS — BP 135/70 | HR 71 | Temp 98.0°F | Resp 16 | Ht 65.0 in | Wt 189.0 lb

## 2023-11-18 DIAGNOSIS — I10 Essential (primary) hypertension: Secondary | ICD-10-CM

## 2023-11-18 DIAGNOSIS — M79601 Pain in right arm: Secondary | ICD-10-CM

## 2023-11-18 DIAGNOSIS — E039 Hypothyroidism, unspecified: Secondary | ICD-10-CM

## 2023-11-18 DIAGNOSIS — E782 Mixed hyperlipidemia: Secondary | ICD-10-CM

## 2023-11-18 DIAGNOSIS — G5621 Lesion of ulnar nerve, right upper limb: Secondary | ICD-10-CM

## 2023-11-18 DIAGNOSIS — Z23 Encounter for immunization: Secondary | ICD-10-CM

## 2023-11-18 MED ORDER — TETANUS-DIPHTH-ACELL PERTUSSIS 5-2.5-18.5 LF-MCG/0.5 IM SUSY
0.5000 mL | PREFILLED_SYRINGE | Freq: Once | INTRAMUSCULAR | 0 refills | Status: AC
Start: 2023-11-18 — End: 2023-11-18

## 2023-11-18 MED ORDER — MELOXICAM 7.5 MG PO TABS: 7.5000 mg | ORAL_TABLET | Freq: Every day | ORAL | 0 refills | Status: DC

## 2023-11-18 NOTE — Progress Notes (Signed)
 [facility] [address] [place], [place] [address]  Internal MEDICINE  Office Visit Note  Patient Name: Kenneth Navarro  [id number]  [id number]  Date of Service: 12/02/2023  Chief Complaint  Patient presents with   Follow-up   Quality Metric Gaps    Shingles, TDAP and Colonoscopy    Arm Pain    Right arm pain    HPI Pt is here for routine follow up -BP stable -tolerating crestor and synthroid well. Needs updated labs -Right arm pain from wrist and goes up arm. Last 2 fingers get numb, Worse when lifting. Getting better since last week. Not taking any medication. Started 2 months ago. No injury. Does not wear brace but may try this. -did cologuard -wants to wait on shingles vaccines, will think about tdap -has not seen eye doctor yet, advised to schedule  Current Medication: Outpatient Encounter Medications as of 11/18/2023  Medication Sig   levothyroxine (SYNTHROID) 50 MCG tablet Take 1 tablet (50 mcg total) by mouth daily. On empty stomach   losartan (COZAAR) 50 MG tablet Take 1 tablet (50 mg total) by mouth daily.   meloxicam (MOBIC) 7.5 MG tablet Take 1 tablet (7.5 mg total) by mouth daily.   rosuvastatin (CRESTOR) 5 MG tablet Take 1 tablet (5 mg total) by mouth daily.   [DISCONTINUED] Tdap (BOOSTRIX) 5-2.5-18.5 LF-MCG/0.5 injection Inject 0.5 mLs into the muscle once.   [EXPIRED] Tdap (BOOSTRIX) 5-2.5-18.5 LF-MCG/0.5 injection Inject 0.5 mLs into the muscle once for 1 dose.   No facility-administered encounter medications on file as of 11/18/2023.    Surgical History: Past Surgical History:  Procedure Laterality Date   left arm pins removed  01/2013   left arm surgery  09/23/2012   after fall, pins placed   LUMBAR LAMINECTOMY/DECOMPRESSION MICRODISCECTOMY Left 04/12/2016   Procedure: MICRO LUMBAR DECOMPRESSION L4 - L5 ON THE LEFT;  Surgeon: Kenneth Every, MD;  Location: [facility];  Service: Orthopedics;  Laterality: Left;    Medical History: Past Medical  History:  Diagnosis Date   HNP (herniated nucleus pulposus)    stenosis   Pain in left wrist    middle, rign and last finger hurt all the way to elbow since fall Sep 17, 2012   PONV (postoperative nausea and vomiting)    ponv after 1st left arm surgery    Family History: History reviewed. No pertinent family history.  Social History   Socioeconomic History   Marital status: Single    Spouse name: Not on file   Number of children: Not on file   Years of education: Not on file   Highest education level: Not on file  Occupational History   Not on file  Tobacco Use   Smoking status: Never   Smokeless tobacco: Never  Substance and Sexual Activity   Alcohol use: No   Drug use: No   Sexual activity: Not Currently  Other Topics Concern   Not on file  Social History Narrative   Not on file   Social Drivers of Health   Financial Resource Strain: Not on file  Food Insecurity: Not on file  Transportation Needs: Not on file  Physical Activity: Not on file  Stress: Not on file  Social Connections: Not on file  Intimate Partner Violence: Not on file      Review of Systems  Constitutional:  Negative for chills, fatigue and unexpected weight change.  HENT:  Positive for postnasal drip. Negative for congestion, rhinorrhea, sneezing and sore throat.  Eyes:  Negative for redness.  Respiratory:  Negative for cough, chest tightness and shortness of breath.   Cardiovascular:  Negative for chest pain and palpitations.  Gastrointestinal:  Negative for abdominal pain, constipation, diarrhea, nausea and vomiting.  Genitourinary:  Negative for dysuria and frequency.  Musculoskeletal:  Positive for arthralgias and myalgias. Negative for back pain, joint swelling and neck pain.  Skin:  Negative for rash.  Neurological: Negative.  Negative for tremors and numbness.  Hematological:  Negative for adenopathy. Does not bruise/bleed easily.  Psychiatric/Behavioral:  Negative for behavioral  problems (Depression), sleep disturbance and suicidal ideas. The patient is not nervous/anxious.     Vital Signs: BP 135/70   Pulse 71   Temp 98 F (36.7 C)   Resp 16   Ht 5\' 5"  (1.651 m)   Wt 189 lb (85.7 kg)   SpO2 97%   BMI 31.45 kg/m    Physical Exam Vitals and nursing note reviewed.  Constitutional:      Appearance: Normal appearance.  HENT:     Head: Normocephalic and atraumatic.     Nose: Nose normal.     Mouth/Throat:     Mouth: Mucous membranes are moist.     Pharynx: No posterior oropharyngeal erythema.  Eyes:     Extraocular Movements: Extraocular movements intact.  Cardiovascular:     Rate and Rhythm: Normal rate and regular rhythm.     Pulses: Normal pulses.     Heart sounds: Normal heart sounds.  Pulmonary:     Effort: Pulmonary effort is normal.     Breath sounds: Normal breath sounds.  Neurological:     General: No focal deficit present.     Mental Status: He is alert.  Psychiatric:        Mood and Affect: Mood normal.        Behavior: Behavior normal.        Assessment/Plan: 1. Benign hypertension (Primary) Improved, continue current medication  2. Pain in right arm May start mobic once daily as needed and may consider brace. Will refer to ortho - meloxicam (MOBIC) 7.5 MG tablet; Take 1 tablet (7.5 mg total) by mouth daily.  Dispense: 30 tablet; Refill: 0 - AMB referral to orthopedics  3. Ulnar neuropathy of right upper extremity - meloxicam (MOBIC) 7.5 MG tablet; Take 1 tablet (7.5 mg total) by mouth daily.  Dispense: 30 tablet; Refill: 0 - AMB referral to orthopedics  4. Hypothyroidism, unspecified type Will recheck labs, continue synthroid - TSH + free T4  5. Need for Tdap vaccination - Tdap (BOOSTRIX) 5-2.5-18.5 LF-MCG/0.5 injection; Inject 0.5 mLs into the muscle once for 1 dose.  Dispense: 0.5 mL; Refill: 0  6. Mixed hyperlipidemia Continue crestor, will update labs - Lipid Panel With LDL/HDL Ratio   General Counseling:  Kenneth Navarro verbalizes understanding of the findings of todays visit and agrees with plan of treatment. I have discussed any further diagnostic evaluation that may be needed or ordered today. We also reviewed his medications today. he has been encouraged to call the office with any questions or concerns that should arise related to todays visit.    Orders Placed This Encounter  Procedures   TSH + free T4   Lipid Panel With LDL/HDL Ratio   AMB referral to orthopedics    Meds ordered this encounter  Medications   meloxicam (MOBIC) 7.5 MG tablet    Sig: Take 1 tablet (7.5 mg total) by mouth daily.    Dispense:  30 tablet  Refill:  0   Tdap (BOOSTRIX) 5-2.5-18.5 LF-MCG/0.5 injection    Sig: Inject 0.5 mLs into the muscle once for 1 dose.    Dispense:  0.5 mL    Refill:  0    This patient was seen by Lynn Ito, PA-C in collaboration with Dr. Beverely Risen as a part of collaborative care agreement.   Total time spent:30 Minutes Time spent includes review of chart, medications, test results, and follow up plan with the patient.      Dr Lyndon Code Internal medicine

## 2023-11-19 ENCOUNTER — Telehealth: Payer: Self-pay | Admitting: Nurse Practitioner

## 2023-11-19 NOTE — Telephone Encounter (Signed)
 Awaiting 11/18/23 office notes for Orthopedic referral-Toni

## 2023-11-22 ENCOUNTER — Ambulatory Visit: Admitting: Physician Assistant

## 2023-12-03 ENCOUNTER — Telehealth: Payer: Self-pay | Admitting: Physician Assistant

## 2023-12-03 NOTE — Telephone Encounter (Addendum)
 Orthopedic referral faxed to [facility] due to insurance; [phone]. Notified patient's daughter, Davene Costain. Gave pt telephone # [phone]

## 2023-12-16 ENCOUNTER — Other Ambulatory Visit: Payer: Self-pay | Admitting: Physician Assistant

## 2023-12-16 DIAGNOSIS — G5621 Lesion of ulnar nerve, right upper limb: Secondary | ICD-10-CM

## 2023-12-16 DIAGNOSIS — M79601 Pain in right arm: Secondary | ICD-10-CM

## 2024-01-04 LAB — LIPID PANEL WITH LDL/HDL RATIO
Cholesterol, Total: 144 mg/dL (ref 100–199)
HDL: 32 mg/dL — ABNORMAL LOW (ref >39)
LDL Chol Calc (NIH): 92 mg/dL (ref 0–99)
LDL/HDL Ratio: 2.9 ratio (ref 0.0–3.6)
Triglycerides: 106 mg/dL (ref 0–149)
VLDL Cholesterol Cal: 20 mg/dL (ref 5–40)

## 2024-01-04 LAB — TSH+FREE T4
Free T4: 0.97 ng/dL (ref 0.82–1.77)
TSH: 0.897 u[IU]/mL (ref 0.450–4.500)

## 2024-01-08 ENCOUNTER — Ambulatory Visit: Payer: Self-pay | Admitting: Physician Assistant

## 2024-01-08 NOTE — Telephone Encounter (Signed)
-----   Message from Jacques Mattock sent at 01/08/2024  1:37 PM EDT ----- Please let him know that his thyroid and cholesterol are greatly improved and to continue medications as before

## 2024-01-08 NOTE — Telephone Encounter (Signed)
 Pt daughter advised fir labs result

## 2024-01-08 NOTE — Progress Notes (Signed)
 Lmom to call us back

## 2024-01-19 ENCOUNTER — Other Ambulatory Visit: Payer: Self-pay | Admitting: Physician Assistant

## 2024-01-19 DIAGNOSIS — M79601 Pain in right arm: Secondary | ICD-10-CM

## 2024-01-19 DIAGNOSIS — G5621 Lesion of ulnar nerve, right upper limb: Secondary | ICD-10-CM

## 2024-03-23 ENCOUNTER — Ambulatory Visit: Admitting: Nurse Practitioner

## 2024-07-16 ENCOUNTER — Encounter: Payer: BLUE CROSS/BLUE SHIELD | Admitting: Nurse Practitioner

## 2024-07-24 ENCOUNTER — Other Ambulatory Visit: Payer: Self-pay | Admitting: Internal Medicine

## 2024-07-24 DIAGNOSIS — I10 Essential (primary) hypertension: Secondary | ICD-10-CM

## 2024-07-24 DIAGNOSIS — Z1211 Encounter for screening for malignant neoplasm of colon: Secondary | ICD-10-CM

## 2024-07-27 ENCOUNTER — Other Ambulatory Visit: Payer: Self-pay | Admitting: Nurse Practitioner

## 2024-07-27 DIAGNOSIS — Z1211 Encounter for screening for malignant neoplasm of colon: Secondary | ICD-10-CM

## 2024-07-27 DIAGNOSIS — I10 Essential (primary) hypertension: Secondary | ICD-10-CM

## 2024-07-29 ENCOUNTER — Telehealth: Payer: Self-pay | Admitting: Nurse Practitioner

## 2024-07-29 NOTE — Telephone Encounter (Signed)
 Lvm to reschedule 07/16/2024 missed appointment-Toni

## 2024-08-12 ENCOUNTER — Telehealth: Payer: Self-pay | Admitting: Nurse Practitioner

## 2024-08-12 NOTE — Telephone Encounter (Signed)
 Left 2nd  vm regarding missed appointment-Toni

## 2024-08-24 ENCOUNTER — Other Ambulatory Visit: Payer: Self-pay | Admitting: Nurse Practitioner

## 2024-08-24 ENCOUNTER — Telehealth: Payer: Self-pay

## 2024-08-24 DIAGNOSIS — Z1211 Encounter for screening for malignant neoplasm of colon: Secondary | ICD-10-CM

## 2024-08-24 DIAGNOSIS — I10 Essential (primary) hypertension: Secondary | ICD-10-CM

## 2024-08-24 NOTE — Telephone Encounter (Signed)
 Lmom to call us  back pt need appt for refills

## 2024-09-23 ENCOUNTER — Encounter: Admitting: Nurse Practitioner

## 2024-09-29 ENCOUNTER — Encounter: Payer: Self-pay | Admitting: Nurse Practitioner

## 2024-09-29 ENCOUNTER — Ambulatory Visit: Payer: Self-pay | Admitting: Nurse Practitioner

## 2024-09-29 VITALS — BP 135/88 | HR 81 | Temp 97.7°F | Resp 16 | Ht 65.0 in | Wt 189.8 lb

## 2024-09-29 DIAGNOSIS — I10 Essential (primary) hypertension: Secondary | ICD-10-CM | POA: Insufficient documentation

## 2024-09-29 DIAGNOSIS — E782 Mixed hyperlipidemia: Secondary | ICD-10-CM | POA: Insufficient documentation

## 2024-09-29 DIAGNOSIS — Z0001 Encounter for general adult medical examination with abnormal findings: Secondary | ICD-10-CM

## 2024-09-29 DIAGNOSIS — E039 Hypothyroidism, unspecified: Secondary | ICD-10-CM | POA: Insufficient documentation

## 2024-09-29 MED ORDER — ROSUVASTATIN CALCIUM 5 MG PO TABS: 5.0000 mg | ORAL_TABLET | Freq: Every day | ORAL | 3 refills | Status: AC

## 2024-09-29 MED ORDER — LEVOTHYROXINE SODIUM 50 MCG PO TABS: 50.0000 ug | ORAL_TABLET | Freq: Every day | ORAL | 3 refills | Status: AC

## 2024-09-29 MED ORDER — LOSARTAN POTASSIUM 50 MG PO TABS: 50.0000 mg | ORAL_TABLET | Freq: Every day | ORAL | 3 refills | Status: AC

## 2024-09-29 NOTE — Progress Notes (Signed)
 [facility] [address] [place], [place] [address]  Internal MEDICINE  Office Visit Note  Patient Name: Kenneth Navarro  [id number]  [id number]  Date of Service: 09/29/2024  Chief Complaint  Patient presents with   Annual Exam    HPI Kenneth Navarro presents for an annual well visit and physical exam.  Well-appearing 54 y.o. male with  hypertension, high cholesterol and hypothyroidism.  Routine CRC screening: done in 2024, cologuard was negativ. Due in 2027.  Labs: deferred for now, no insurance.  New or worsening pain: none  Other concerns: none     Current Medication: Outpatient Encounter Medications as of 09/29/2024  Medication Sig   levothyroxine  (SYNTHROID ) 50 MCG tablet Take 1 tablet (50 mcg total) by mouth daily. On empty stomach   losartan  (COZAAR ) 50 MG tablet Take 1 tablet (50 mg total) by mouth daily.   rosuvastatin  (CRESTOR ) 5 MG tablet Take 1 tablet (5 mg total) by mouth daily.   [DISCONTINUED] levothyroxine  (SYNTHROID ) 50 MCG tablet Take 1 tablet (50 mcg total) by mouth daily. On empty stomach   [DISCONTINUED] losartan  (COZAAR ) 50 MG tablet TAKE 1 TABLET(50 MG) BY MOUTH DAILY   [DISCONTINUED] meloxicam  (MOBIC ) 7.5 MG tablet TAKE 1 TABLET(7.5 MG) BY MOUTH DAILY   [DISCONTINUED] rosuvastatin  (CRESTOR ) 5 MG tablet Take 1 tablet (5 mg total) by mouth daily.   No facility-administered encounter medications on file as of 09/29/2024.    Surgical History: Past Surgical History:  Procedure Laterality Date   left arm pins removed  01/2013   left arm surgery  09/23/2012   after fall, pins placed   LUMBAR LAMINECTOMY/DECOMPRESSION MICRODISCECTOMY Left 04/12/2016   Procedure: MICRO LUMBAR DECOMPRESSION L4 - L5 ON THE LEFT;  Surgeon: Reyes Billing, MD;  Location: [facility];  Service: Orthopedics;  Laterality: Left;    Medical History: Past Medical History:  Diagnosis Date   HNP (herniated nucleus pulposus)    stenosis   Pain in left wrist    middle, rign and last  finger hurt all the way to elbow since fall Sep 17, 2012   PONV (postoperative nausea and vomiting)    ponv after 1st left arm surgery    Family History: History reviewed. No pertinent family history.  Social History   Socioeconomic History   Marital status: Single    Spouse name: Not on file   Number of children: Not on file   Years of education: Not on file   Highest education level: Not on file  Occupational History   Not on file  Tobacco Use   Smoking status: Never   Smokeless tobacco: Never  Substance and Sexual Activity   Alcohol use: No   Drug use: No   Sexual activity: Not Currently  Other Topics Concern   Not on file  Social History Narrative   Not on file   Social Drivers of Health   Tobacco Use: Low Risk (09/29/2024)   Patient History    Smoking Tobacco Use: Never    Smokeless Tobacco Use: Never    Passive Exposure: Not on file  Financial Resource Strain: Not on file  Food Insecurity: Not on file  Transportation Needs: Not on file  Physical Activity: Not on file  Stress: Not on file  Social Connections: Not on file  Intimate Partner Violence: Not on file  Depression (EYV7-0): Low Risk (09/29/2024)   Depression (PHQ2-9)    PHQ-2 Score: 0  Alcohol Screen: Not on file  Housing: Not on file  Utilities: Not  on file  Health Literacy: Not on file      Review of Systems  Constitutional:  Negative for activity change, appetite change, chills, fatigue, fever and unexpected weight change.  HENT: Negative.  Negative for congestion, ear pain, rhinorrhea, sore throat and trouble swallowing.   Eyes: Negative.   Respiratory: Negative.  Negative for cough, chest tightness, shortness of breath and wheezing.   Cardiovascular: Negative.  Negative for chest pain and palpitations.  Gastrointestinal:  Positive for abdominal pain (intermittent, brief, resolved now.). Negative for blood in stool, constipation, diarrhea, nausea and vomiting.  Endocrine: Negative.    Genitourinary: Negative.  Negative for difficulty urinating, dysuria, frequency, hematuria and urgency.  Musculoskeletal: Negative.  Negative for arthralgias, back pain, joint swelling, myalgias and neck pain.  Skin: Negative.  Negative for rash and wound.  Allergic/Immunologic: Negative.  Negative for immunocompromised state.  Neurological: Negative.  Negative for dizziness, seizures, numbness and headaches.  Hematological: Negative.   Psychiatric/Behavioral: Negative.  Negative for behavioral problems, self-injury and suicidal ideas. The patient is not nervous/anxious.     Vital Signs: BP (!) 140/94   Pulse 81   Temp 97.7 F (36.5 C)   Resp 16   Ht 5' 5 (1.651 m)   Wt 189 lb 12.8 oz (86.1 kg)   SpO2 96%   BMI 31.58 kg/m    Physical Exam Vitals reviewed.  Constitutional:      General: He is not in acute distress.    Appearance: Normal appearance. He is obese. He is not ill-appearing.  HENT:     Head: Normocephalic and atraumatic.     Right Ear: Tympanic membrane, ear canal and external ear normal.     Left Ear: Tympanic membrane, ear canal and external ear normal.     Nose: Nose normal. No congestion or rhinorrhea.     Mouth/Throat:     Mouth: Mucous membranes are moist.     Pharynx: Oropharynx is clear. No oropharyngeal exudate or posterior oropharyngeal erythema.  Eyes:     Extraocular Movements: Extraocular movements intact.     Conjunctiva/sclera: Conjunctivae normal.     Pupils: Pupils are equal, round, and reactive to light.  Neck:     Vascular: No carotid bruit.  Cardiovascular:     Rate and Rhythm: Normal rate and regular rhythm.     Pulses: Normal pulses.     Heart sounds: Normal heart sounds. No murmur heard. Pulmonary:     Effort: Pulmonary effort is normal. No respiratory distress.     Breath sounds: Normal breath sounds. No wheezing.  Abdominal:     General: Bowel sounds are normal. There is no distension.     Palpations: Abdomen is soft. There is no  mass.     Tenderness: There is no abdominal tenderness. There is no guarding or rebound.     Hernia: No hernia is present.  Musculoskeletal:        General: Normal range of motion.     Cervical back: Normal range of motion and neck supple.     Right lower leg: No edema.     Left lower leg: No edema.  Lymphadenopathy:     Cervical: No cervical adenopathy.  Skin:    General: Skin is warm and dry.     Capillary Refill: Capillary refill takes less than 2 seconds.  Neurological:     General: No focal deficit present.     Mental Status: He is alert and oriented to person, place, and time.  Cranial Nerves: No cranial nerve deficit.     Coordination: Coordination normal.     Gait: Gait normal.  Psychiatric:        Mood and Affect: Mood normal.        Behavior: Behavior normal.        Thought Content: Thought content normal.        Judgment: Judgment normal.        Assessment/Plan: 1. Encounter for routine adult health examination with abnormal findings (Primary) Age-appropriate preventive screenings and vaccinations discussed, annual physical exam completed. Routine labs for health maintenance deferred for now, due to no insurance. PHM updated.    2. Benign hypertension Stable, continue losartan  as prescribed.  - losartan  (COZAAR ) 50 MG tablet; Take 1 tablet (50 mg total) by mouth daily.  Dispense: 90 tablet; Refill: 3  3. Mixed hyperlipidemia Continue rosuvastatin  as prescribed.  - rosuvastatin  (CRESTOR ) 5 MG tablet; Take 1 tablet (5 mg total) by mouth daily.  Dispense: 90 tablet; Refill: 3  4. Acquired hypothyroidism Continue levothyroxine  as prescribed.  - levothyroxine  (SYNTHROID ) 50 MCG tablet; Take 1 tablet (50 mcg total) by mouth daily. On empty stomach  Dispense: 90 tablet; Refill: 3     General Counseling: Kingslee verbalizes understanding of the findings of todays visit and agrees with plan of treatment. I have discussed any further diagnostic evaluation that may  be needed or ordered today. We also reviewed his medications today. he has been encouraged to call the office with any questions or concerns that should arise related to todays visit.    Orders Placed This Encounter  Procedures   UA/M w/rflx Culture, Routine    Meds ordered this encounter  Medications   levothyroxine  (SYNTHROID ) 50 MCG tablet    Sig: Take 1 tablet (50 mcg total) by mouth daily. On empty stomach    Dispense:  90 tablet    Refill:  3   losartan  (COZAAR ) 50 MG tablet    Sig: Take 1 tablet (50 mg total) by mouth daily.    Dispense:  90 tablet    Refill:  3    For future refills   rosuvastatin  (CRESTOR ) 5 MG tablet    Sig: Take 1 tablet (5 mg total) by mouth daily.    Dispense:  90 tablet    Refill:  3    Return in about 6 months (around 03/29/2025) for F/U, Khayree Delellis PCP.   Total time spent:30 Minutes Time spent includes review of chart, medications, test results, and follow up plan with the patient.   [place] Controlled Substance Database was reviewed by me.  This patient was seen by Mardy Maxin, FNP-C in collaboration with Dr. Sigrid Bathe as a part of collaborative care agreement.  Mack Thurmon R. Maxin, MSN, FNP-C Internal medicine

## 2024-10-23 ENCOUNTER — Encounter: Admitting: Nurse Practitioner

## 2025-04-02 ENCOUNTER — Ambulatory Visit: Admitting: Nurse Practitioner

## 2025-09-30 ENCOUNTER — Encounter: Admitting: Nurse Practitioner
